# Patient Record
Sex: Female | Born: 1965
Health system: Southern US, Community
[De-identification: ages and names within clinical notes are randomized; demographics above are authoritative.]

## PROBLEM LIST (undated history)

## (undated) DIAGNOSIS — T148XXA Other injury of unspecified body region, initial encounter: Secondary | ICD-10-CM

## (undated) DIAGNOSIS — E119 Type 2 diabetes mellitus without complications: Secondary | ICD-10-CM

## (undated) HISTORY — DX: Type 2 diabetes mellitus without complications: E11.9

## (undated) HISTORY — PX: HERNIA REPAIR: SHX51

## (undated) HISTORY — DX: Other injury of unspecified body region, initial encounter: T14.8XXA

---

## 1998-06-30 ENCOUNTER — Other Ambulatory Visit: Admission: RE | Admit: 1998-06-30 | Discharge: 1998-06-30 | Payer: Self-pay | Admitting: Obstetrics and Gynecology

## 1998-10-08 ENCOUNTER — Encounter: Admission: RE | Admit: 1998-10-08 | Discharge: 1999-01-06 | Payer: Self-pay | Admitting: Obstetrics and Gynecology

## 1998-11-21 ENCOUNTER — Inpatient Hospital Stay (HOSPITAL_COMMUNITY): Admission: AD | Admit: 1998-11-21 | Discharge: 1998-11-25 | Payer: Self-pay | Admitting: Obstetrics and Gynecology

## 1998-12-24 ENCOUNTER — Other Ambulatory Visit: Admission: RE | Admit: 1998-12-24 | Discharge: 1998-12-24 | Payer: Self-pay | Admitting: Obstetrics and Gynecology

## 2001-04-15 ENCOUNTER — Inpatient Hospital Stay (HOSPITAL_COMMUNITY): Admission: EM | Admit: 2001-04-15 | Discharge: 2001-04-17 | Payer: Self-pay | Admitting: Emergency Medicine

## 2001-04-15 ENCOUNTER — Encounter (INDEPENDENT_AMBULATORY_CARE_PROVIDER_SITE_OTHER): Payer: Self-pay

## 2001-09-24 ENCOUNTER — Encounter: Payer: Self-pay | Admitting: *Deleted

## 2001-09-25 ENCOUNTER — Inpatient Hospital Stay (HOSPITAL_COMMUNITY): Admission: AD | Admit: 2001-09-25 | Discharge: 2001-09-26 | Payer: Self-pay | Admitting: *Deleted

## 2001-09-25 ENCOUNTER — Encounter: Payer: Self-pay | Admitting: *Deleted

## 2001-10-05 ENCOUNTER — Ambulatory Visit (HOSPITAL_COMMUNITY): Admission: RE | Admit: 2001-10-05 | Discharge: 2001-10-05 | Payer: Self-pay | Admitting: *Deleted

## 2001-10-05 ENCOUNTER — Encounter: Payer: Self-pay | Admitting: *Deleted

## 2001-12-18 ENCOUNTER — Encounter: Admission: RE | Admit: 2001-12-18 | Discharge: 2001-12-18 | Payer: Self-pay | Admitting: *Deleted

## 2001-12-27 ENCOUNTER — Encounter: Admission: RE | Admit: 2001-12-27 | Discharge: 2001-12-27 | Payer: Self-pay | Admitting: *Deleted

## 2002-01-02 ENCOUNTER — Encounter: Admission: RE | Admit: 2002-01-02 | Discharge: 2002-01-02 | Payer: Self-pay | Admitting: *Deleted

## 2002-01-08 ENCOUNTER — Ambulatory Visit (HOSPITAL_COMMUNITY): Admission: RE | Admit: 2002-01-08 | Discharge: 2002-01-08 | Payer: Self-pay | Admitting: *Deleted

## 2002-01-09 ENCOUNTER — Encounter: Admission: RE | Admit: 2002-01-09 | Discharge: 2002-01-09 | Payer: Self-pay | Admitting: *Deleted

## 2002-01-16 ENCOUNTER — Encounter: Admission: RE | Admit: 2002-01-16 | Discharge: 2002-01-16 | Payer: Self-pay | Admitting: *Deleted

## 2002-01-20 ENCOUNTER — Encounter: Payer: Self-pay | Admitting: Obstetrics and Gynecology

## 2002-01-20 ENCOUNTER — Inpatient Hospital Stay (HOSPITAL_COMMUNITY): Admission: AD | Admit: 2002-01-20 | Discharge: 2002-01-24 | Payer: Self-pay | Admitting: Obstetrics and Gynecology

## 2002-01-20 ENCOUNTER — Encounter (INDEPENDENT_AMBULATORY_CARE_PROVIDER_SITE_OTHER): Payer: Self-pay | Admitting: Specialist

## 2002-01-25 ENCOUNTER — Inpatient Hospital Stay (HOSPITAL_COMMUNITY): Admission: AD | Admit: 2002-01-25 | Discharge: 2002-01-25 | Payer: Self-pay | Admitting: *Deleted

## 2003-04-16 ENCOUNTER — Other Ambulatory Visit: Admission: RE | Admit: 2003-04-16 | Discharge: 2003-04-16 | Payer: Self-pay | Admitting: Gynecology

## 2004-07-13 ENCOUNTER — Other Ambulatory Visit: Admission: RE | Admit: 2004-07-13 | Discharge: 2004-07-13 | Payer: Self-pay | Admitting: Gynecology

## 2004-11-11 ENCOUNTER — Other Ambulatory Visit: Admission: RE | Admit: 2004-11-11 | Discharge: 2004-11-11 | Payer: Self-pay | Admitting: Internal Medicine

## 2005-07-04 ENCOUNTER — Ambulatory Visit: Payer: Self-pay | Admitting: Internal Medicine

## 2005-08-23 ENCOUNTER — Other Ambulatory Visit: Admission: RE | Admit: 2005-08-23 | Discharge: 2005-08-23 | Payer: Self-pay | Admitting: Gynecology

## 2006-05-04 ENCOUNTER — Ambulatory Visit (HOSPITAL_COMMUNITY): Admission: RE | Admit: 2006-05-04 | Discharge: 2006-05-04 | Payer: Self-pay | Admitting: Gynecology

## 2006-08-28 ENCOUNTER — Other Ambulatory Visit: Admission: RE | Admit: 2006-08-28 | Discharge: 2006-08-28 | Payer: Self-pay | Admitting: Gynecology

## 2007-04-16 ENCOUNTER — Ambulatory Visit (HOSPITAL_COMMUNITY): Admission: RE | Admit: 2007-04-16 | Discharge: 2007-04-16 | Payer: Self-pay | Admitting: Internal Medicine

## 2008-08-19 ENCOUNTER — Emergency Department (HOSPITAL_COMMUNITY): Admission: EM | Admit: 2008-08-19 | Discharge: 2008-08-19 | Payer: Self-pay | Admitting: Family Medicine

## 2009-02-17 ENCOUNTER — Emergency Department (HOSPITAL_COMMUNITY): Admission: EM | Admit: 2009-02-17 | Discharge: 2009-02-17 | Payer: Self-pay | Admitting: Emergency Medicine

## 2009-07-08 ENCOUNTER — Ambulatory Visit: Payer: Self-pay | Admitting: Family Medicine

## 2009-08-12 ENCOUNTER — Ambulatory Visit: Payer: Self-pay | Admitting: Internal Medicine

## 2009-08-28 ENCOUNTER — Ambulatory Visit (HOSPITAL_COMMUNITY): Admission: RE | Admit: 2009-08-28 | Discharge: 2009-08-28 | Payer: Self-pay | Admitting: Internal Medicine

## 2009-12-08 ENCOUNTER — Encounter (INDEPENDENT_AMBULATORY_CARE_PROVIDER_SITE_OTHER): Payer: Self-pay | Admitting: Family Medicine

## 2009-12-08 ENCOUNTER — Ambulatory Visit: Payer: Self-pay | Admitting: Internal Medicine

## 2009-12-08 LAB — CONVERTED CEMR LAB
ALT: 15 units/L (ref 0–35)
AST: 15 units/L (ref 0–37)
Albumin: 4.1 g/dL (ref 3.5–5.2)
BUN: 10 mg/dL (ref 6–23)
Basophils Absolute: 0 10*3/uL (ref 0.0–0.1)
Basophils Relative: 1 % (ref 0–1)
Calcium: 9.3 mg/dL (ref 8.4–10.5)
Chloride: 104 meq/L (ref 96–112)
MCHC: 32.3 g/dL (ref 30.0–36.0)
Monocytes Relative: 7 % (ref 3–12)
Neutro Abs: 2 10*3/uL (ref 1.7–7.7)
Neutrophils Relative %: 34 % — ABNORMAL LOW (ref 43–77)
Potassium: 4.3 meq/L (ref 3.5–5.3)
RBC: 4.3 M/uL (ref 3.87–5.11)
WBC: 5.8 10*3/uL (ref 4.0–10.5)

## 2009-12-09 ENCOUNTER — Encounter (INDEPENDENT_AMBULATORY_CARE_PROVIDER_SITE_OTHER): Payer: Self-pay | Admitting: Family Medicine

## 2009-12-20 ENCOUNTER — Emergency Department (HOSPITAL_COMMUNITY): Admission: EM | Admit: 2009-12-20 | Discharge: 2009-12-20 | Payer: Self-pay | Admitting: Family Medicine

## 2010-04-06 ENCOUNTER — Encounter (INDEPENDENT_AMBULATORY_CARE_PROVIDER_SITE_OTHER): Payer: Self-pay | Admitting: *Deleted

## 2010-09-09 LAB — GLUCOSE, CAPILLARY

## 2010-10-13 ENCOUNTER — Other Ambulatory Visit: Payer: Self-pay | Admitting: Family Medicine

## 2010-10-13 ENCOUNTER — Ambulatory Visit (HOSPITAL_COMMUNITY)
Admission: RE | Admit: 2010-10-13 | Discharge: 2010-10-13 | Disposition: A | Discharge status: Home / Self Care | Payer: Self-pay | Source: Ambulatory Visit | Attending: Family Medicine | Admitting: Family Medicine

## 2010-10-13 DIAGNOSIS — M25519 Pain in unspecified shoulder: Secondary | ICD-10-CM | POA: Insufficient documentation

## 2010-10-13 DIAGNOSIS — M25512 Pain in left shoulder: Secondary | ICD-10-CM

## 2010-10-15 NOTE — Op Note (Signed)
NAME:  Kristen West, Kristen West                      ACCOUNT NO.:  [id number]   MEDICAL RECORD NO.:  [id number]                   PATIENT TYPE:  INP   LOCATION:  9104                                 FACILITY:  [facility]   PHYSICIAN:  Phil D. Okey Dupre, M.D.                  DATE OF BIRTH:  May 15, 1966   DATE OF PROCEDURE:  01/20/2002  DATE OF DISCHARGE:                                 OPERATIVE REPORT   PROCEDURE:  Low flap cesarean section with low vertical extension.   PREOPERATIVE DIAGNOSIS:  1. 35-4/[redacted] week gestation with premature ruptured membranes.  2. Previous cesarean section.  3. Oblique lie.   POSTOPERATIVE DIAGNOSIS:  1. 35-4/[redacted] week gestation with premature rupture membranes.  2. Previous cesarean section.  3. Transverse lie with back down.   PROCEDURE:  Under satisfactory spinal anesthesia, the patient in the dorsal  spine position the abdomen was prepped and West Foley catheter placed in the  urinary bladder, the abdomen was then draped in the usual sterile manner.  The abdomen was entered through West previous transverse Pfannenstiel incision  and the abdomen was opened by layers. This was situated 3 cm above the  symphysis pubis and extended for West total up to 16 cm.  On entering the  peritoneal cavity the visceral peritoneum and the anterior surface of the  uterus was opened transversely by sharp dissection and the bladder pushed  away from the lower uterine segment. It was entered by sharp and blunt  dissection.  Immediately I went for the baby's head and found that I had an  arm and West hand. I tried to manipulate to grab either the vertex or West leg but  the back was done and after some manipulation I realized this was not  feasible and extended the incision vertically for approximately 5 cm with West  bandage scissors.  This allowed me to grab the left leg which I brought down  and delivered as West breech extraction.  The cord was doubled clamped,  divided. The baby was handed to the  pediatrician and the placenta was  manually removed.  The uterus explored and closed by their incision being in  the midpoint with West continuous running, lock 0 Vicryl and atraumatic needle.  The vertical portion of the incision was run from the top down with  continuous running lock 0 Vicryl and an atraumatic needle and at the bottom  it was merged with the previous incision.  Two figure-of-eights were used  where there was some oozing along the suture line using the same suture. The  area was observed for bleeding and was noted and all sutures were cut short.  The fascia was then closed with the incision meeting in the midpoint with West  continuous running alternating lock 0 Vicryl and an atraumatic needle.  Subcutaneous bleeders were controlled with hot cautery.  Skin staples were  used in the skin as  approximation. West dry, sterile dressing was applied. The  patient was transferred to the recovery room in satisfactory condition  having tolerated the procedure well. Tape, instrument, sponge and needle  count reported correct at the end of the procedure. The baby was West female  infant. No weight available at this time.  The cord pH was 7.37 and Apgars  of 6 and 9.  The baby had somewhat underdeveloped genitalia with West  significant hypospadius according to the pediatrician.                                               Phil D. Okey Dupre, M.D.    PDR/MEDQ  D:  01/20/2002  T:  01/21/2002  Job:  [id number]

## 2010-10-15 NOTE — H&P (Signed)
[facility]  Patient:    Kristen West, OLDS Visit Number: [id number] MRN: [id number]          Service Type: SUR Location: 4W 0448 01 Attending Physician:  Bonnetta Barry Dictated by:   Velora Heckler, M.D. Admit Date:  04/15/2001                           History and Physical  REFERRING PHYSICIAN:  Dr. Smitty Cords. Cheek from the emergency department.  REASON FOR ADMISSION:  Incarcerated umbilical hernia with probable strangulated omentum.  BRIEF HISTORY:  Patient is a 45 year old [place] female who apparently had seen her primary care physician earlier this week about abdominal pain and incarcerated umbilical hernia.  Patient had had an umbilical hernia present for approximately two years.  This was originally noted during her recent pregnancy.  However, the patient did not proceed with elective repair.  Over the past three days, the patient has had notable increase in the size of the hernia with pain, tenderness and an area of ecchymosis over the umbilicus. Patient presents today to the emergency department for evaluation and management.  Patient denies nausea and vomiting.  She has had normal bowel movements.  She denies fever or chills.  The patient was seen and evaluated in the emergency department by Dr. Beverely Pace.  He feels this is an incarcerated umbilical hernia with possible strangulation.  Laboratory studies are within normal limits.  EKG shows normal sinus rhythm.  General surgery is now called for admission and management.  PAST MEDICAL HISTORY:  History of childbirth, June 2000, by cesarean section at [facility] in [place].  History of positive PPD, on unknown medication for treatment.  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  ALLERGIES:  None known.  SOCIAL HISTORY:  Patient is accompanied by her husband.  They live in [place].  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  She  does not smoke. She does not drink alcohol.  FAMILY HISTORY:  Noncontributory.  REVIEW OF SYSTEMS:  Fifteen-system review unremarkable except as noted above.  PHYSICAL EXAMINATION:  GENERAL:  [age]-year-old well-developed, well-nourished female.  VITAL SIGNS:  Temperature 98.0, pulse 108, respirations 20, blood pressure 135/78.  HEENT:  Normocephalic, atraumatic.  Sclerae are clear.  Dentition is good.  NECK:  Supple without masses.  Thyroid is normal without nodularity.  LUNGS:  Clear to auscultation.  There is no costovertebral angle tenderness.  CARDIAC:  Exam shows a mild sinus tachycardia with regular rhythm.  There are no murmurs.  ABDOMEN:  Soft, protuberant, with an obvious incarcerated umbilical hernia. There is ecchymosis overlying the hernia.  It is exquisitely tender to palpation.  It measures approximately 6 to 7 cm at greatest dimension.  It is not reducible.  There is a well-healed Pfannenstiel incision without sign of hernia.  Remainder of the abdomen is soft and nontender.  EXTREMITIES:  Nontender without edema.  NEUROLOGIC:  The patient is alert and oriented to person, place and time without focal neurologic deficits.  LABORATORY STUDIES:  Complete blood count shows a white count of 5.5, a hemoglobin of 12.9, platelet count 326,000.  Differential is normal. Chemistry profile is within normal limits.  Liver function tests are normal. Urinalysis is benign.  EKG:  Twelve-lead EKG shows normal sinus rhythm without acute change.  IMPRESSION:  Incarcerated umbilical hernia, likely strangulated omentum.  PLAN:  I discussed at length with the patient and  her husband the indications for urgent surgery, both for symptomatic relief and to rule out endangerment of the intestine.  Patient will be taken directly to the operating room here at [facility] for exploration and repair of incarcerated umbilical hernia.  Patient may or may not  require use of prosthetic mesh. Patient may or may not require a subcutaneous drainage.  Patient and her husband are in agreement.  They wish to proceed. Dictated by:   Velora Heckler, M.D. Attending Physician:  Bonnetta Barry DD:  04/15/01 TD:  04/16/01 Job: [id number]

## 2010-10-15 NOTE — Discharge Summary (Signed)
NAME:  Kristen West, Kristen West                      ACCOUNT NO.:  [id number]   MEDICAL RECORD NO.:  [id number]                   PATIENT TYPE:  INP   LOCATION:  9104                                 FACILITY:  [facility]   PHYSICIAN:  Phil D. Okey Dupre, M.D.                  DATE OF BIRTH:  [birth year]   DATE OF ADMISSION:  01/20/2002  DATE OF DISCHARGE:  01/24/2002                                 DISCHARGE SUMMARY   PROCEDURE:  Low flap cesarean section with low vertical extension.   HISTORY OF PRESENT ILLNESS:  This 45 year old G2, P1-0-0-1 presented to the  MAU at [facility] at 35 weeks and 4/7 days gestational age complaining  of leaking of fluid at 8:30 that morning.  The patient was receiving her  prenatal care at the high risk clinic secondary to gestational diabetes  which she is controlling with diet.   OB HISTORY:  OB history includes West prior cesarean section secondary to fetal  bradycardia in 2000 and the patient is also group B streptococcus positive.  She was treated with penicillin in July 2003.   LABORATORY DATA:  The patient's prenatal labs included blood type West  positive.  Antibody screen negative.  Hemoglobin 13.4.  Platelets 237.  Rubella immune.  Hepatitis negative.  Syphilis negative. HIV negative.  Gonorrhea and Chlamydia negative.   PHYSICAL EXAMINATION:  Vital signs were stable on admission.  Temperature  98.  Pulse 83.  Blood pressure 137/85.  Exam showed positive pulling,  positive Nitrazine and positive ferning.  The patient was admitted after  ultrasound revealed West fetus in transverse/oblique lie and was prepped for  cesarean section by Dr. Okey Dupre.   PROCEDURE:  Dr. Okey Dupre performed West low flap cesarean section with low vertical  extension as fetal lie was transverse with back down.  West viable female infant  was born with Apgars of 6 at one minute and 9 at five minutes.  The  procedure was uncomplicated with an estimated blood loss of 700 cc.   HOSPITAL COURSE:  The  patient tolerated the cesarean section well.  Her  postoperative hemoglobin was 10.1 on January 22, 2002.  On postop day #3 the  patient was feeling well.  Her infant is in the NICU here in [facility] and she is pumping her breast milk.  Vital signs are stable.  She  is afebrile. Blood pressure 128/70 on the day of discharge.  She does not  desire any contraception when she goes home.   DISCHARGE DISPOSITION:  The patient is stable.   DISCHARGE MEDICATIONS:  1. Percocet 5/325 mg one p.o. q.4h. p.r.n. pain.  2. Prenatal vitamins one p.o. q.d. #30 given.  3. Colace 100 mg one p.o. q.h.s. p.r.n. constipation #30.   DIET:  Regular.   ACTIVITY:  Pelvic rest times 6 weeks with nothing in the vagina.    FOLLOW UP:  The patient is  to return to [facility] in six weeks for her  postpartum check, and the patient will return to MAU in two days for staple  removal.     Billey Gosling, M.D.                       Phil D. Okey Dupre, M.D.    AS/MEDQ  D:  01/23/2002  T:  01/24/2002  Job:  [id number]

## 2010-10-15 NOTE — Op Note (Signed)
[facility]  Patient:    Kristen West, Kristen West Visit Number: [id number] MRN: [id number]          Service Type: SUR Location: 4W 0448 01 Attending Physician:  Bonnetta Barry Dictated by:   Velora Heckler, M.D. Proc. Date: 04/15/01 Admit Date:  04/15/2001                             Operative Report  PREOPERATIVE DIAGNOSIS:  Incarcerated umbilical hernia, strangulated omentum.  POSTOPERATIVE DIAGNOSIS:  Incarcerated umbilical hernia, strangulated omentum.  PROCEDURE:  Repair of incarcerated umbilical hernia, partial omentectomy.  SURGEON:  Velora Heckler, M.D.  ANESTHESIA:  General per Lucille Passy, M.D.  ESTIMATED BLOOD LOSS:  Minimal.  PREPARATION:  Betadine.  COMPLICATIONS:  None.  INDICATIONS:  The patient is a 45 year old [place] female who presents today to the emergency department with a three day history of severe abdominal pain and incarcerated umbilical hernia.  She had had an umbilical hernia present for approximately two years.  The patient now comes to surgery urgently for repair.  DESCRIPTION OF PROCEDURE:  The procedure was done in OR #1 at the [facility].  The patient was brought to the operating room and placed in the supine position on the operating room table.  Following the administration of general anesthesia, the patient was prepped and draped in the usual strict aseptic fashion.  After ascertaining that an adequate level of anesthesia had been obtained, an infraumbilical incision was made with a #10 blade.  Dissection was carried down through the subcutaneous tissues and hemostasis obtained with the electrocautery.  The fascial plane is developed. The neck of the hernia is identified.  Hernia sac is incised.  It contains approximately 30 cc of serous fluid which was evacuated.  There was apparent incarcerated strangulated omentum.  The hernia sac was dissected away from the under surface of  the umbilicus.  The fascia is defined circumferentially.  The hernia ring was opened.  Omentum is pulled up through the hernia defect until viable omentum was noted.  Omentum was then divided between hemostats and ligated with 2-0 silk suture ligatures.  The hernia sac and incarcerated omentum are passed off the field as a specimen.  The hernia defect measures approximately 3 cm in greatest dimension.  The edges are debrided to healthy fascia.  The wound was then closed transversely with interrupted 0 Ethibond sutures.  Good hemostasis was noted.  The umbilicus was then reaffixed to the abdominal wall with interrupted 0 Vicryl sutures.  The subcutaneous tissues are closed with interrupted 3-0 Vicryl sutures.  The skin edges are closed with stainless steel staples.  Sterile dressings are applied.  The patient was awakened from anesthesia and brought to the recovery room in stable condition.  The patient tolerated the procedure well. Dictated by:   Velora Heckler, M.D. Attending Physician:  Bonnetta Barry DD:  04/15/01 TD:  04/16/01 Job: [id number]

## 2010-10-15 NOTE — Discharge Summary (Signed)
[facility]  Patient:    Kristen West, Kristen West Visit Number: [id number] MRN: [id number]          Service Type: OBS Location: 9300 [id number] 01 Attending Physician:  Michaelle Copas Dictated by:   Velora Heckler, M.D. Admit Date:  09/24/2001 Discharge Date: 09/26/2001   CC:         Conni Elliot, M.D.   Discharge Summary  REASON FOR ADMISSION:         Rule out colonic obstruction.  BRIEF HISTORY:                The patient is a 45 year old female presents to the maternity admissions at [redacted] weeks gestation of her second pregnancy for evaluation for abdominal pain and diarrhea.  The patient was seen and evaluated by Dr. Corky Sox.  General surgery was consulted for possible bowel obstruction.  HOSPITAL COURSE:              The patient was seen in maternity admissions. Abdominal x-rays were obtained, which demonstrated a diffuse dilated colon and small bowel with air fluid levels consistent with either ileus or distal colonic obstruction.  The patient was admitted, placed on intravenous hydration, maintained n.p.o.  The following morning, September 25, 2001, she underwent repeat abdominal x-ray showing a similar-type gas pattern.  After extensive discussion between obstetrics, radiology, and general surgery, we proceeded with Gastrografin enema.  This demonstrated no evidence of distal colonic obstruction.  Postprocedure, the patient had onset of flatus and bowel movements.  She felt greatly relieved.  She was started on a clear-liquid diet.  Over the ensuing 24 hours, she was advanced to a regular diet, which she tolerated well.  On the morning of discharge, the patient was without pain and tolerating a regular diet.  DISCHARGE PLANNING:           The patient is discharged home on September 26, 2001 in good condition, tolerating a regular diet, without abdominal discomfort. Normal gastrointestinal function had returned.  The patient had  remained stable throughout her hospital course.  DISCHARGE MEDICATIONS:        1. Prenatal vitamins.                               2. Tylenol as needed.  FOLLOWUP:                     The patient will follow up with her obstetrician at her regularly scheduled appointment.  FINAL DIAGNOSIS:              Colonic ileus, probable gastroenteritis.  CONDITION AT DISCHARGE:       Improved. Dictated by:   Velora Heckler, M.D. Attending Physician:  Michaelle Copas DD:  09/26/01 TD:  09/27/01 Job: [id number]

## 2011-12-28 ENCOUNTER — Encounter (HOSPITAL_COMMUNITY): Payer: Self-pay | Admitting: Emergency Medicine

## 2011-12-28 ENCOUNTER — Emergency Department (INDEPENDENT_AMBULATORY_CARE_PROVIDER_SITE_OTHER)
Admission: EM | Admit: 2011-12-28 | Discharge: 2011-12-28 | Disposition: A | Discharge status: Home / Self Care | Payer: Self-pay | Source: Home / Self Care

## 2011-12-28 DIAGNOSIS — N61 Mastitis without abscess: Secondary | ICD-10-CM

## 2011-12-28 MED ORDER — DOXYCYCLINE HYCLATE 100 MG PO TABS: 100.0000 mg | ORAL_TABLET | Freq: Two times a day (BID) | ORAL | Status: AC

## 2011-12-28 MED ORDER — CEFTRIAXONE SODIUM 1 G IJ SOLR
1.0000 g | Freq: Once | INTRAMUSCULAR | Status: AC
  Administered 2011-12-28: 1 g via INTRAMUSCULAR

## 2011-12-28 MED ORDER — LIDOCAINE HCL (PF) 1 % IJ SOLN
INTRAMUSCULAR | Status: AC
  Filled 2011-12-28: qty 5

## 2011-12-28 MED ORDER — CEFTRIAXONE SODIUM 1 G IJ SOLR
INTRAMUSCULAR | Status: AC
  Filled 2011-12-28: qty 10

## 2011-12-28 NOTE — ED Notes (Signed)
Pain in left breast for 3-4 days.  Reports a hard knot, redness, pain.  No history of this, no injury.  Reports regular mammograms and nothing has been wrong. It has been at least a year since last mammogram.  Reports fever yesterday

## 2011-12-28 NOTE — ED Notes (Signed)
Patient and family member asking questions about how to get medicine filled, dicussed options -area pharmacy promotional  programs

## 2011-12-28 NOTE — ED Notes (Signed)
Extensive search for cheapest medications.  Given pharmacy cards displayed at urgent care

## 2011-12-28 NOTE — ED Provider Notes (Signed)
History     CSN: [id number]  Arrival date & time 12/28/11  1225   First MD Initiated Contact with Patient 12/28/11 1252      Chief Complaint  Patient presents with  . Breast Pain    (Consider location/radiation/quality/duration/timing/severity/associated sxs/prior treatment) HPI Comments: 46 year old female with a schedule mammograms comes in for left breast pain, redness for the last 3 days prior to her visit. Progressively getting worse. She relates no cuts or sores no trauma.  The history is provided by the patient.    History reviewed. No pertinent past medical history.  Past Surgical History  Procedure Date  . Hernia repair   . Cesarean section     No family history on file.  History  Substance Use Topics  . Smoking status: Never Smoker   . Smokeless tobacco: Not on file  . Alcohol Use: No    OB History    Grav Para Term Preterm Abortions TAB SAB Ect Mult Living                  Review of Systems  Constitutional: Negative for fever, chills, activity change and appetite change.    Allergies  Review of patient's allergies indicates not on file.  Home Medications   Current Outpatient Rx  Name Route Sig Dispense Refill  . IBUPROFEN 200 MG PO TABS Oral Take 200 mg by mouth every 6 (six) hours as needed.      BP 112/77  Pulse 82  Temp 98.1 F (36.7 C) (Oral)  Resp 16  SpO2 99%  Physical Exam  Nursing note and vitals reviewed. Constitutional: She is oriented to person, place, and time. Vital signs are normal. She appears well-developed and well-nourished.  Non-toxic appearance. She does not have a sickly appearance. She does not appear ill. No distress.  Pulmonary/Chest: Effort normal. Left breast exhibits no inverted nipple, no mass, no nipple discharge, no skin change and no tenderness. Breasts are symmetrical.  Neurological: She is alert and oriented to person, place, and time.  Skin: Skin is warm. No rash noted. There is erythema. No pallor.       ED Course  Procedures (including critical care time)  Labs Reviewed - No data to display No results found.   No diagnosis found.    MDM  46 year old comes in for breast pain, with redness erythema and warm to touch likely a cellulitis. No fluctuating masses or abscess. We'll give her Rocephin one dose of intramuscular now. Doxy the next 7 days. Patient is fasting during daylight for the next one week we'll start her on intramuscular Rocephin and continue Doxy.        Marinda Elk, MD 12/28/11 Zollie Pee

## 2011-12-28 NOTE — ED Notes (Signed)
Instructed to change into gown

## 2011-12-28 NOTE — ED Notes (Signed)
Discharge delay secondary to post antibiotic injection delay

## 2011-12-28 NOTE — ED Notes (Signed)
Assisted with physician exam/evaluation of breast.

## 2012-07-27 ENCOUNTER — Encounter (HOSPITAL_COMMUNITY): Payer: Self-pay | Admitting: Emergency Medicine

## 2012-07-27 ENCOUNTER — Emergency Department (INDEPENDENT_AMBULATORY_CARE_PROVIDER_SITE_OTHER)
Admission: EM | Admit: 2012-07-27 | Discharge: 2012-07-27 | Disposition: A | Discharge status: Home / Self Care | Payer: No Typology Code available for payment source | Source: Home / Self Care

## 2012-07-27 DIAGNOSIS — L309 Dermatitis, unspecified: Secondary | ICD-10-CM

## 2012-07-27 DIAGNOSIS — IMO0002 Reserved for concepts with insufficient information to code with codable children: Secondary | ICD-10-CM

## 2012-07-27 DIAGNOSIS — S46911A Strain of unspecified muscle, fascia and tendon at shoulder and upper arm level, right arm, initial encounter: Secondary | ICD-10-CM

## 2012-07-27 DIAGNOSIS — L259 Unspecified contact dermatitis, unspecified cause: Secondary | ICD-10-CM

## 2012-07-27 DIAGNOSIS — M65839 Other synovitis and tenosynovitis, unspecified forearm: Secondary | ICD-10-CM

## 2012-07-27 MED ORDER — NAPROXEN 375 MG PO TABS: 375.0000 mg | ORAL_TABLET | Freq: Two times a day (BID) | ORAL | Status: DC

## 2012-07-27 MED ORDER — TRIAMCINOLONE ACETONIDE 0.1 % EX CREA: TOPICAL_CREAM | Freq: Two times a day (BID) | CUTANEOUS | Status: DC

## 2012-07-27 NOTE — ED Notes (Signed)
Pt is here for pain on right arm x1 month States the pain starts out on the shoulder and radiates downward; numbness of bilateral middle finger c/o left hand pain; sx include: bruising Denies inj/trauma to right arm and left hand Also c/o a rash/eczema on right foot; sx include: itching Used to go to [facility] but due to their closure has not been able to find new PCP  She is alert w/no signs of acute distress.

## 2012-07-27 NOTE — ED Provider Notes (Signed)
Medical screening examination/treatment/procedure(s) were performed by resident physician or non-physician practitioner and as supervising physician I was immediately available for consultation/collaboration.   Barkley Bruns MD.   Linna Hoff, MD 07/27/12 [phone]

## 2012-07-27 NOTE — ED Provider Notes (Signed)
History     CSN: [id number]  Arrival date & time 07/27/12  1037   First MD Initiated Contact with Patient 07/27/12 1044      Chief Complaint  Patient presents with  . Arm Pain    (Consider location/radiation/quality/duration/timing/severity/associated sxs/prior treatment) HPI Comments: 47-year-old [place] female presents with pain in her left wrist and thumb, pain in her right posterior shoulder and deltoid and a rash on her right foot. It is unclear how long she has had some new symptoms but the right arm discomfort for approximately one month. The rash  For several months. Denies any known injury or trauma.  The pain on her right shoulder is worse with certain movements such as abduction beyond the 100 and placing on behind the back. Pain in the thumb is worse with grasping and other movements involving the thumb.   History reviewed. No pertinent past medical history.  Past Surgical History  Procedure Laterality Date  . Hernia repair    . Cesarean section      No family history on file.  History  Substance Use Topics  . Smoking status: Never Smoker   . Smokeless tobacco: Not on file  . Alcohol Use: No    OB History   Grav Para Term Preterm Abortions TAB SAB Ect Mult Living                  Review of Systems  Constitutional: Negative.   HENT: Negative.   Respiratory: Negative.   Gastrointestinal: Negative.   Musculoskeletal: Positive for myalgias and arthralgias. Negative for back pain, joint swelling and gait problem.  Skin: Positive for rash.  Neurological: Negative.     Allergies  Review of patient's allergies indicates no known allergies.  Home Medications   Current Outpatient Rx  Name  Route  Sig  Dispense  Refill  . ibuprofen (ADVIL,MOTRIN) 200 MG tablet   Oral   Take 200 mg by mouth every 6 (six) hours as needed.         . naproxen (NAPROSYN) 375 MG tablet   Oral   Take 1 tablet (375 mg total) by mouth 2 (two) times daily. Prn shoulder  pain   20 tablet   0   . triamcinolone cream (KENALOG) 0.1 %   Topical   Apply topically 2 (two) times daily.   30 g   0     BP 144/83  Pulse 90  Temp(Src) 99.9 F (37.7 C) (Oral)  Resp 17  SpO2 99%  Physical Exam  Nursing note and vitals reviewed. Constitutional: She appears well-developed and well-nourished. No distress.  HENT:  Head: Normocephalic and atraumatic.  Neck: Normal range of motion. Neck supple.  Cardiovascular: Normal rate and regular rhythm.   Pulmonary/Chest: Effort normal. No respiratory distress.  Musculoskeletal: She exhibits no edema.  Right shoulder evaluation reveals pain in the posterior and lateral aspect of the shoulder in the latissimus dorsi muscle. There is also mild tenderness in the upper right deltoid. It is worse with moving her arm backwards. There is no tenderness along the joint line. No swelling or asymmetry. Left thumb reveals a small area of tenderness just proximal to the MCP. Lourena Simmonds is equivocally positive and there is tenderness over the associated tendon. There is mild tenderness along the border of the thenar eminence. Right foot has a well marginated overweight scaly itchy rash to the medial aspect.  Neurological: She is alert. She exhibits normal muscle tone. Coordination normal.  Skin: Skin is  warm and dry. Rash noted.  Psychiatric: She has a normal mood and affect.       Procedures (including critical care time)  Labs Reviewed - No data to display No results found.   1. Shoulder strain, right, initial encounter   2. Tendinitis of thumb   3. Eczema       MDM  Naprosyn 375 mg twice a day with food when necessary pain in the thumb and right shoulder. Do not take ibuprofen with it. Triamcinolone cream applied to the eczematoid rash on the right foot. Apply a splint to the left thumb to wear for at least one week. Followup with primary care doctor which will be the [facility], get an appointment  today.        Hayden Rasmussen, NP 07/27/12 1344

## 2012-09-14 ENCOUNTER — Encounter (HOSPITAL_COMMUNITY): Payer: Self-pay

## 2012-09-14 ENCOUNTER — Emergency Department (HOSPITAL_COMMUNITY)
Admission: EM | Admit: 2012-09-14 | Discharge: 2012-09-14 | Disposition: A | Discharge status: Home Health | Payer: No Typology Code available for payment source | Source: Home / Self Care

## 2012-09-14 DIAGNOSIS — M129 Arthropathy, unspecified: Secondary | ICD-10-CM

## 2012-09-14 DIAGNOSIS — M654 Radial styloid tenosynovitis [de Quervain]: Secondary | ICD-10-CM

## 2012-09-14 DIAGNOSIS — M1990 Unspecified osteoarthritis, unspecified site: Secondary | ICD-10-CM

## 2012-09-14 DIAGNOSIS — G5600 Carpal tunnel syndrome, unspecified upper limb: Secondary | ICD-10-CM

## 2012-09-14 DIAGNOSIS — G5601 Carpal tunnel syndrome, right upper limb: Secondary | ICD-10-CM

## 2012-09-14 MED ORDER — OMEPRAZOLE 20 MG PO CPDR: 20.0000 mg | DELAYED_RELEASE_CAPSULE | Freq: Every day | ORAL | Status: DC

## 2012-09-14 MED ORDER — NAPROXEN 500 MG PO TABS: 500.0000 mg | ORAL_TABLET | Freq: Two times a day (BID) | ORAL | Status: DC

## 2012-09-14 MED ORDER — TRIAMCINOLONE ACETONIDE 0.1 % EX CREA: TOPICAL_CREAM | Freq: Two times a day (BID) | CUTANEOUS | Status: DC

## 2012-09-14 NOTE — Progress Notes (Signed)
Patient Demographics  Kristen West, is a 47 y.o. female  HYQ:[id number]  [id number]:[id number]  DOB - 06/23/1965  Chief Complaint  Patient presents with  . Foot Pain        Subjective:   Kristen West today is here to establish primary care. Patient has No headache, No chest pain, No abdominal pain - No Nausea, No new weakness tingling or numbness, No Cough - SOB. She claims that she has tingling and numbness in her right hand particularly at night, claims that her right thenar muscles have atrophy. Claims to have left thumb area pain as well for the past 3 months.  Objective:    Filed Vitals:   09/14/12 1107  BP: 109/69  Pulse: 93  Temp: 97.4 F (36.3 C)  TempSrc: Oral  Resp: 17     ALLERGIES:  No Known Allergies  PAST MEDICAL HISTORY: Eczema  PAST SURGICAL HISTORY: Past Surgical History  Procedure Laterality Date  . Hernia repair    . Cesarean section      FAMILY HISTORY: No family history of CAD  MEDICATIONS AT HOME: Prior to Admission medications   Medication Sig Start Date End Date Taking? Authorizing Provider  naproxen (NAPROSYN) 500 MG tablet Take 1 tablet (500 mg total) by mouth 2 (two) times daily with a meal. Prn shoulder pain 09/14/12   Maretta Bees, MD  omeprazole (PRILOSEC) 20 MG capsule Take 1 capsule (20 mg total) by mouth daily. 09/14/12   Kristen Kohlman Levora Dredge, MD  triamcinolone cream (KENALOG) 0.1 % Apply topically 2 (two) times daily. 09/14/12   Kristen Lamay Levora Dredge, MD    REVIEW OF SYSTEMS:  Constitutional:   No   Fevers, chills, fatigue.  HEENT:    No headaches, Sore throat,   Cardio-vascular: No chest pain,  Orthopnea, swelling in lower extremities, anasarca, palpitations  GI:  No abdominal pain, nausea, vomiting, diarrhea  Resp: No shortness of breath,  No coughing up of blood.No cough.No wheezing.  Skin:  no rash or lesions.  GU:  no dysuria, change in color of urine, no urgency or frequency.  No flank  pain.  Musculoskeletal: No joint pain or swelling.  No decreased range of motion.  No back pain.  Psych: No change in mood or affect. No depression or anxiety.  No memory loss.   Exam  General appearance :Awake, alert, not in any distress. Speech Clear. Not toxic Looking HEENT: Atraumatic and Normocephalic, pupils equally reactive to light and accomodation Neck: supple, no JVD. No cervical lymphadenopathy.  Chest:Good air entry bilaterally, no added sounds  CVS: S1 S2 regular, no murmurs.  Abdomen: Bowel sounds present, Non tender and not distended with no gaurding, rigidity or rebound. Extremities: B/L Lower Ext shows no edema, both legs are warm to touch. Positive for right thenar muscle atrophy Neurology: Awake alert, and oriented X 3, CN II-XII intact, Non focal Skin:No Rash Wounds:N/A    Data Review   CBC No results found for this basename: WBC, HGB, HCT, PLT, MCV, MCH, MCHC, RDW, NEUTRABS, LYMPHSABS, MONOABS, EOSABS, BASOSABS, BANDABS, BANDSABD,  in the last 168 hours  Chemistries   No results found for this basename: NA, K, CL, CO2, GLUCOSE, BUN, CREATININE, GFRCGP, CALCIUM, MG, AST, ALT, ALKPHOS, BILITOT,  in the last 168 hours ------------------------------------------------------------------------------------------------------------------ No results found for this basename: HGBA1C,  in the last 72 hours ------------------------------------------------------------------------------------------------------------------ No results found for this basename: CHOL, HDL, LDLCALC, TRIG, CHOLHDL, LDLDIRECT,  in the last 72 hours ------------------------------------------------------------------------------------------------------------------ No results found for this  basename: TSH, T4TOTAL, FREET3, T3FREE, THYROIDAB,  in the last 72 hours ------------------------------------------------------------------------------------------------------------------ No results found for this  basename: VITAMINB12, FOLATE, FERRITIN, TIBC, IRON, RETICCTPCT,  in the last 72 hours  Coagulation profile  No results found for this basename: INR, PROTIME,  in the last 168 hours    Assessment & Plan   Right thumb pain - Suspect de Quervain's tenosynovitis  - Repeat a naproxen 500 mg twice daily with meals - Apply cold compresses - Reassess next visit - Provided literature for patient - If persists-may need orthopedic/sports medicine evaluation  Arthritis - Claims to have been given mostly MCP areas of bilateral hand - Checking RA, anti-CCP, ESR  Suspected carpal tunnel syndrome - has right thenar muscle atrophy and numbness especially at night - Referred to neurology-have askedRN to make appropriate referral  Eczema - RIGHT foot - Continue with triamcinolone cream  Please check routine labs at next visit-have ordered- CBC, chemistries, TSH, lipids, A1c, RA factor, anti-CCP antibodies  Follow-up Information   Follow up with [facility]. Schedule an appointment as soon as possible for a visit in 1 month.

## 2012-09-14 NOTE — ED Notes (Signed)
Referral faxed to [facility] for neurology appt

## 2012-09-14 NOTE — ED Notes (Signed)
Patient is here to be seen for foot and shoulder pain Husband states she has had pain for 10 years

## 2012-09-14 NOTE — ED Notes (Signed)
Referral faxed to [facility]

## 2012-09-25 NOTE — ED Notes (Signed)
Referral faxed to neurology appt 12/03/12 Dr Leanna Battles

## 2013-05-15 ENCOUNTER — Emergency Department (INDEPENDENT_AMBULATORY_CARE_PROVIDER_SITE_OTHER): Payer: No Typology Code available for payment source

## 2013-05-15 ENCOUNTER — Emergency Department (HOSPITAL_COMMUNITY)
Admission: EM | Admit: 2013-05-15 | Discharge: 2013-05-15 | Disposition: A | Discharge status: Home / Self Care | Payer: No Typology Code available for payment source | Source: Home / Self Care | Attending: Family Medicine | Admitting: Family Medicine

## 2013-05-15 ENCOUNTER — Encounter (HOSPITAL_COMMUNITY): Payer: Self-pay | Admitting: Emergency Medicine

## 2013-05-15 DIAGNOSIS — M79641 Pain in right hand: Secondary | ICD-10-CM

## 2013-05-15 DIAGNOSIS — M79609 Pain in unspecified limb: Secondary | ICD-10-CM

## 2013-05-15 MED ORDER — DICLOFENAC SODIUM 50 MG PO TBEC: 50.0000 mg | DELAYED_RELEASE_TABLET | Freq: Two times a day (BID) | ORAL | Status: DC

## 2013-05-15 NOTE — ED Notes (Signed)
Patient tripped and fell while walking on concrete on Saturday.  Reports the complaint today is right hand pain, no wrist pain.  Reports she tried to stop fall, but landed on right hand, opened.  Now has pain in hand behind fingers, in palm and back of hand

## 2013-05-15 NOTE — ED Provider Notes (Signed)
Kristen West is a 47 y.o. female who presents to Urgent Care today for right hand pain. Patient tripped and fell 3 days ago injuring her right hand. She has pain at the distal fourth and fifth metacarpals. She denies any wrist or elbow pain. She denies any difficulties motion radiating pain weakness or numbness. She has not tried any medications yet. She feels well otherwise. No nausea vomiting or diarrhea.   History reviewed. No pertinent past medical history. History  Substance Use Topics  . Smoking status: Never Smoker   . Smokeless tobacco: Not on file  . Alcohol Use: No   ROS as above Medications reviewed. No current facility-administered medications for this encounter.   Current Outpatient Prescriptions  Medication Sig Dispense Refill  . diclofenac (VOLTAREN) 50 MG EC tablet Take 1 tablet (50 mg total) by mouth 2 (two) times daily.  30 tablet  0  . [DISCONTINUED] omeprazole (PRILOSEC) 20 MG capsule Take 1 capsule (20 mg total) by mouth daily.  30 capsule  0    Exam:  BP 113/66  Pulse 82  Temp(Src) 98.1 F (36.7 C) (Oral)  Resp 18  SpO2 100% Gen: Well NAD Right hand: Swelling and ecchymosis present distal fourth and fifth metacarpals. Tender palpation fourth and fifth metacarpals. Nontender otherwise and hand or wrist. Motion is intact. Capillary refill and sensation are intact distally. Elbow and shoulder on the right side are nontender with normal motion.   No results found for this or any previous visit (from the past 24 hour(s)). Dg Hand Complete Right  05/15/2013   CLINICAL DATA:  Traumatic injury and pain  EXAM: RIGHT HAND - COMPLETE 3+ VIEW  COMPARISON:  None.  FINDINGS: There is no evidence of fracture or dislocation. There is no evidence of arthropathy or other focal bone abnormality. Soft tissues are unremarkable.  IMPRESSION: No acute abnormality noted.   Electronically Signed   By: Alcide Clever M.D.   On: 05/15/2013 12:50   The fourth and fifth digits were  buddy taped together in a dorsal splint was applied over both digits crossing the DIP and MCP  Assessment and Plan: 47 y.o. female with hand pain following fall. No obvious ligament or bone injury. Patient requests a splint.  I placed a dorsal splint to use for about one week. Additionally use diclofenac. Followup if not improved. Discussed warning signs or symptoms. Please see discharge instructions. Patient expresses understanding.      Rodolph Bong, MD 05/15/13 1318

## 2013-12-28 DIAGNOSIS — Q159 Congenital malformation of eye, unspecified: Secondary | ICD-10-CM | POA: Insufficient documentation

## 2014-01-08 LAB — HEMOGLOBIN A1C: Hemoglobin A1C: 7.5

## 2014-01-08 LAB — BASIC METABOLIC PANEL: Glucose: 197

## 2014-02-10 ENCOUNTER — Ambulatory Visit: Payer: Self-pay

## 2014-08-28 ENCOUNTER — Ambulatory Visit: Payer: Self-pay | Attending: Internal Medicine | Admitting: Internal Medicine

## 2014-08-28 ENCOUNTER — Encounter: Payer: Self-pay | Admitting: Internal Medicine

## 2014-08-28 VITALS — BP 117/77 | HR 74 | Temp 98.5°F | Resp 16 | Ht <= 58 in | Wt 119.6 lb

## 2014-08-28 DIAGNOSIS — J302 Other seasonal allergic rhinitis: Secondary | ICD-10-CM

## 2014-08-28 DIAGNOSIS — Z8739 Personal history of other diseases of the musculoskeletal system and connective tissue: Secondary | ICD-10-CM

## 2014-08-28 DIAGNOSIS — E119 Type 2 diabetes mellitus without complications: Secondary | ICD-10-CM

## 2014-08-28 DIAGNOSIS — M069 Rheumatoid arthritis, unspecified: Secondary | ICD-10-CM | POA: Insufficient documentation

## 2014-08-28 LAB — COMPLETE METABOLIC PANEL WITH GFR
ALBUMIN: 4.3 g/dL (ref 3.5–5.2)
ALK PHOS: 62 U/L (ref 39–117)
ALT: 13 U/L (ref 0–35)
AST: 13 U/L (ref 0–37)
BILIRUBIN TOTAL: 0.5 mg/dL (ref 0.2–1.2)
BUN: 13 mg/dL (ref 6–23)
CO2: 26 meq/L (ref 19–32)
Calcium: 9.3 mg/dL (ref 8.4–10.5)
Chloride: 104 mEq/L (ref 96–112)
Creat: 0.61 mg/dL (ref 0.50–1.10)
GFR, Est African American: >89 mL/min
GFR, Est Non African American: >89 mL/min
GLUCOSE: 144 mg/dL — AB (ref 70–99)
POTASSIUM: 4.1 meq/L (ref 3.5–5.3)
SODIUM: 140 meq/L (ref 135–145)
TOTAL PROTEIN: 7.4 g/dL (ref 6.0–8.3)

## 2014-08-28 LAB — GLUCOSE, POCT (MANUAL RESULT ENTRY): POC Glucose: 136 mg/dl — AB (ref 70–99)

## 2014-08-28 LAB — CBC
HEMATOCRIT: 39.8 % (ref 36.0–46.0)
Hemoglobin: 13.4 g/dL (ref 12.0–15.0)
MCH: 29.4 pg (ref 26.0–34.0)
MCHC: 33.7 g/dL (ref 30.0–36.0)
MCV: 87.3 fL (ref 78.0–100.0)
MPV: 10.1 fL (ref 8.6–12.4)
PLATELETS: 261 10*3/uL (ref 150–400)
RBC: 4.56 MIL/uL (ref 3.87–5.11)
RDW: 13.9 % (ref 11.5–15.5)
WBC: 4.7 10*3/uL (ref 4.0–10.5)

## 2014-08-28 LAB — LIPID PANEL
CHOL/HDL RATIO: 3.6 ratio
Cholesterol: 210 mg/dL — ABNORMAL HIGH (ref 0–200)
HDL: 59 mg/dL (ref >=46)
LDL CALC: 137 mg/dL — AB (ref 0–99)
TRIGLYCERIDES: 72 mg/dL (ref <150)
VLDL: 14 mg/dL (ref 0–40)

## 2014-08-28 LAB — RHEUMATOID FACTOR: Rhuematoid fact SerPl-aCnc: <10 IU/mL (ref <=14)

## 2014-08-28 LAB — POCT GLYCOSYLATED HEMOGLOBIN (HGB A1C): HEMOGLOBIN A1C: 7.3

## 2014-08-28 MED ORDER — LORATADINE 10 MG PO TABS: 10.0000 mg | ORAL_TABLET | Freq: Every day | ORAL | Status: DC

## 2014-08-28 MED ORDER — GLUCOSE BLOOD VI STRP: ORAL_STRIP | Status: DC

## 2014-08-28 MED ORDER — FREESTYLE LANCETS MISC: Status: DC

## 2014-08-28 MED ORDER — METFORMIN HCL ER 500 MG PO TB24: 500.0000 mg | ORAL_TABLET | Freq: Every day | ORAL | Status: DC

## 2014-08-28 MED ORDER — FREESTYLE SYSTEM KIT: 1.0000 | PACK | Status: DC | PRN

## 2014-08-28 NOTE — Progress Notes (Signed)
Pt here to f/u with Pre-diabetes,Knots to right palm area with numbness sensation Sx's started x 1 year ago.  CBG-136, A1c-7.3 [employer] interpretor used for communication

## 2014-08-28 NOTE — Patient Instructions (Signed)
Diabetes and Standards of Medical Care Diabetes is complicated. You may find that your diabetes team includes a dietitian, nurse, diabetes educator, eye doctor, and more. To help everyone know what is going on and to help you get the care you deserve, the following schedule of care was developed to help keep you on track. Below are the tests, exams, vaccines, medicines, education, and plans you will need. HbA1c test This test shows how well you have controlled your glucose over the past 2-3 months. It is used to see if your diabetes management plan needs to be adjusted.   It is performed at least 2 times a year if you are meeting treatment goals.  It is performed 4 times a year if therapy has changed or if you are not meeting treatment goals. Blood pressure test  This test is performed at every routine medical visit. The goal is less than 140/90 mm Hg for most people, but 130/80 mm Hg in some cases. Ask your health care provider about your goal. Dental exam  Follow up with the dentist regularly. Eye exam  If you are diagnosed with type 1 diabetes as a child, get an exam upon reaching the age of 37 years or older and have had diabetes for 3-5 years. Yearly eye exams are recommended after that initial eye exam.  If you are diagnosed with type 1 diabetes as an adult, get an exam within 5 years of diagnosis and then yearly.  If you are diagnosed with type 2 diabetes, get an exam as soon as possible after the diagnosis and then yearly. Foot care exam  Visual foot exams are performed at every routine medical visit. The exams check for cuts, injuries, or other problems with the feet.  A comprehensive foot exam should be done yearly. This includes visual inspection as well as assessing foot pulses and testing for loss of sensation.  Check your feet nightly for cuts, injuries, or other problems with your feet. Tell your health care provider if anything is not healing. Kidney function test (urine  microalbumin)  This test is performed once a year.  Type 1 diabetes: The first test is performed 5 years after diagnosis.  Type 2 diabetes: The first test is performed at the time of diagnosis.  A serum creatinine and estimated glomerular filtration rate (eGFR) test is done once a year to assess the level of chronic kidney disease (CKD), if present. Lipid profile (cholesterol, HDL, LDL, triglycerides)  Performed every 5 years for most people.  The goal for LDL is less than 100 mg/dL. If you are at high risk, the goal is less than 70 mg/dL.  The goal for HDL is 40 mg/dL-50 mg/dL for men and 50 mg/dL-60 mg/dL for women. An HDL cholesterol of 60 mg/dL or higher gives some protection against heart disease.  The goal for triglycerides is less than 150 mg/dL. Influenza vaccine, pneumococcal vaccine, and hepatitis B vaccine  The influenza vaccine is recommended yearly.  It is recommended that people with diabetes who are over 24 years old get the pneumonia vaccine. In some cases, two separate shots may be given. Ask your health care provider if your pneumonia vaccination is up to date.  The hepatitis B vaccine is also recommended for adults with diabetes. Diabetes self-management education  Education is recommended at diagnosis and ongoing as needed. Treatment plan  Your treatment plan is reviewed at every medical visit. Document Released: 03/13/2009 Document Revised: 09/30/2013 Document Reviewed: 10/16/2012 [facility] Patient Information 2015 [place],  [employer]. This information is not intended to replace advice given to you by your health care provider. Make sure you discuss any questions you have with your health care provider.

## 2014-08-28 NOTE — Progress Notes (Signed)
Patient ID: Kristen West, female   DOB: 10-27-1965, 49 y.o.   MRN: [id number]   TDV:[id number]  GGY:[id number]  DOB - [birth year]  CC:  Chief Complaint  Patient presents with  . Follow-up  . Diabetes  . Referral       HPI: Kristen West is a 49 y.o. arabic female here today to establish medical care.  Patient has past medical history of prediabetes and rheumatoid arthritis. Patient reports that she went to see a rheumatologist last April at [facility] but was unable to stay due to expiration of orange card which was two years ago.  She reports that she was tested for rheumatoid arthritis at [facility] a couple of years ago. She states that she has hand/wrist nodules and West for the past 4 years.   Patient reports that she was told that she was prediabetic almost 2 years ago but has not had her levels rechecked since then. She does have a family history of Type 2 Diabetes Mellitus. Patient reports that she has been having blurred vision and some tingling in her toes.   No Known Allergies No past medical history on file. Current Outpatient Prescriptions on File Prior to Visit  Medication Sig Dispense Refill  . diclofenac (VOLTAREN) 50 MG EC tablet Take 1 tablet (50 mg total) by mouth 2 (two) times daily. 30 tablet 0  . [DISCONTINUED] omeprazole (PRILOSEC) 20 MG capsule Take 1 capsule (20 mg total) by mouth daily. 30 capsule 0   No current facility-administered medications on file prior to visit.   No family history on file. History   Social History  . Marital Status: Married    Spouse Name: N/A  . Number of Children: N/A  . Years of Education: N/A   Occupational History  . Not on file.   Social History Main Topics  . Smoking status: Never Smoker   . Smokeless tobacco: Not on file  . Alcohol Use: No  . Drug Use: No  . Sexual Activity: Not on file   Other Topics Concern  . Not on file   Social History Narrative    Review of Systems  Constitutional:  Negative for weight loss.  Eyes: Positive for blurred vision.  Respiratory: Negative.   Cardiovascular: Negative.   Gastrointestinal: Negative.   Neurological: Positive for dizziness, tingling and headaches.  All other systems reviewed and are negative.     Objective:   Filed Vitals:   08/28/14 0929  BP: 117/77  Pulse: 74  Temp: 98.5 F (36.9 C)  Resp: 16    Physical Exam  Constitutional: She is oriented to person, place, and time.  HENT:  Right Ear: External ear normal.  Left Ear: External ear normal.  Mouth/Throat: Oropharynx is clear and moist.  Eyes: EOM are normal. Pupils are equal, round, and reactive to light.  Neck: Normal range of motion. Neck supple.  Cardiovascular: Normal rate, regular rhythm and normal heart sounds.   Pulmonary/Chest: Effort normal and breath sounds normal.  Abdominal: Soft. Bowel sounds are normal.  Musculoskeletal: Normal range of motion. She exhibits no edema.  Lymphadenopathy:    She has no cervical adenopathy.  Neurological: She is alert and oriented to person, place, and time.  Skin: Skin is warm and dry.     Lab Results  Component Value Date   WBC 5.8 12/08/2009   HGB 12.9 12/08/2009   HCT 40.0 12/08/2009   MCV 93.0 12/08/2009   PLT 252 12/08/2009   Lab Results  Component Value Date   CREATININE 0.63 12/08/2009   BUN 10 12/08/2009   NA 139 12/08/2009   K 4.3 12/08/2009   CL 104 12/08/2009   CO2 24 12/08/2009    Lab Results  Component Value Date   HGBA1C 7.30 08/28/2014   Lipid Panel  No results found for: CHOL, TRIG, HDL, CHOLHDL, VLDL, LDLCALC     Assessment and plan:   Anayiah was seen today for follow-up, diabetes and referral.  Diagnoses and all orders for this visit:  Type 2 diabetes mellitus without complication Orders: -     Glucose (CBG) -     HgB A1c -     Lipid panel -     CBC -     COMPLETE METABOLIC PANEL WITH GFR -     Vitamin D, 25-hydroxy -     metFORMIN (GLUCOPHAGE XR) 500 MG 24 hr  tablet; Take 1 tablet (500 mg total) by mouth daily with breakfast. -     glucose monitoring kit (FREESTYLE) monitoring kit; 1 each by Does not apply route as needed. -     glucose blood test strip; Use as instructed -     Lancets (FREESTYLE) lancets; Use as instructed Will begin patient on long acting low dose Metformin. I have explained side effects of diarrhea and abdominal cramping that may persist for the first week of beginning medication.   History of rheumatoid arthritis Orders: -     Rheumatoid factor -     ANA Will recheck, due to no records of diagnoses. Once results return I will send referral for rheumatology.  Other seasonal allergic rhinitis Orders: -     loratadine (CLARITIN) 10 MG tablet; Take 1 tablet (10 mg total) by mouth daily.  Patient and Husband refused to use interpreter services but [place] interpreter phone line was used anyway to make sure there was complete understanding.   Return in about 3 months (around 11/27/2014) for Diabetes Mellitus.    Chari Manning, NP-C [facility] [phone] 08/28/2014, 9:33 AM

## 2014-08-29 LAB — ANA: Anti Nuclear Antibody(ANA): NEGATIVE

## 2014-08-29 LAB — VITAMIN D 25 HYDROXY (VIT D DEFICIENCY, FRACTURES): Vit D, 25-Hydroxy: 14 ng/mL — ABNORMAL LOW (ref 30–100)

## 2014-09-16 ENCOUNTER — Telehealth: Payer: Self-pay | Admitting: *Deleted

## 2014-09-16 MED ORDER — ATORVASTATIN CALCIUM 10 MG PO TABS: 10.0000 mg | ORAL_TABLET | Freq: Every day | ORAL | Status: DC

## 2014-09-16 MED ORDER — ERGOCALCIFEROL 1.25 MG (50000 UT) PO CAPS: 50000.0000 [IU] | ORAL_CAPSULE | ORAL | Status: DC

## 2014-09-16 NOTE — Telephone Encounter (Signed)
Pt is aware of her lab results.  

## 2014-09-16 NOTE — Telephone Encounter (Signed)
-----   Message from Ambrose Finland, NP sent at 09/10/2014  5:56 PM EDT ----- ch9olesterol is elevated and due to her being a diabetic I would like to start her on medication to help lower risk of stroke and heart disease. Please send atorvastatin 10 mg to take daily.   Vitamin D is low. Please send drisdol 50,000 IU to take once weekly for 12 weeks. 12 tablets no refills. No rheumatoid arthritis

## 2015-02-18 ENCOUNTER — Ambulatory Visit: Payer: Self-pay | Attending: Internal Medicine

## 2015-05-07 ENCOUNTER — Ambulatory Visit: Payer: Self-pay

## 2015-11-24 ENCOUNTER — Ambulatory Visit: Payer: Self-pay

## 2015-11-27 ENCOUNTER — Ambulatory Visit: Payer: Self-pay | Attending: Internal Medicine

## 2016-02-22 ENCOUNTER — Encounter: Payer: Self-pay | Admitting: Family Medicine

## 2016-02-22 ENCOUNTER — Ambulatory Visit: Payer: Self-pay | Attending: Family Medicine | Admitting: Family Medicine

## 2016-02-22 ENCOUNTER — Encounter (INDEPENDENT_AMBULATORY_CARE_PROVIDER_SITE_OTHER): Payer: Self-pay

## 2016-02-22 VITALS — BP 121/77 | HR 86 | Temp 97.1°F | Resp 16 | Ht <= 58 in | Wt 127.0 lb

## 2016-02-22 DIAGNOSIS — E1142 Type 2 diabetes mellitus with diabetic polyneuropathy: Secondary | ICD-10-CM | POA: Insufficient documentation

## 2016-02-22 DIAGNOSIS — R05 Cough: Secondary | ICD-10-CM | POA: Insufficient documentation

## 2016-02-22 DIAGNOSIS — IMO0002 Reserved for concepts with insufficient information to code with codable children: Secondary | ICD-10-CM | POA: Insufficient documentation

## 2016-02-22 DIAGNOSIS — E1165 Type 2 diabetes mellitus with hyperglycemia: Secondary | ICD-10-CM | POA: Insufficient documentation

## 2016-02-22 DIAGNOSIS — B353 Tinea pedis: Secondary | ICD-10-CM | POA: Insufficient documentation

## 2016-02-22 DIAGNOSIS — Z7984 Long term (current) use of oral hypoglycemic drugs: Secondary | ICD-10-CM | POA: Insufficient documentation

## 2016-02-22 DIAGNOSIS — R059 Cough, unspecified: Secondary | ICD-10-CM

## 2016-02-22 DIAGNOSIS — R200 Anesthesia of skin: Secondary | ICD-10-CM | POA: Insufficient documentation

## 2016-02-22 DIAGNOSIS — K219 Gastro-esophageal reflux disease without esophagitis: Secondary | ICD-10-CM | POA: Insufficient documentation

## 2016-02-22 DIAGNOSIS — R202 Paresthesia of skin: Secondary | ICD-10-CM | POA: Insufficient documentation

## 2016-02-22 DIAGNOSIS — Z23 Encounter for immunization: Secondary | ICD-10-CM

## 2016-02-22 LAB — POCT GLYCOSYLATED HEMOGLOBIN (HGB A1C): Hemoglobin A1C: 9.4

## 2016-02-22 LAB — GLUCOSE, POCT (MANUAL RESULT ENTRY): POC GLUCOSE: 202 mg/dL — AB (ref 70–99)

## 2016-02-22 MED ORDER — GLIMEPIRIDE 2 MG PO TABS: 2.0000 mg | ORAL_TABLET | Freq: Every day | ORAL | 5 refills | Status: DC

## 2016-02-22 MED ORDER — METFORMIN HCL ER 500 MG PO TB24: 1000.0000 mg | ORAL_TABLET | Freq: Every day | ORAL | 5 refills | Status: DC

## 2016-02-22 MED ORDER — RANITIDINE HCL 300 MG PO TABS: 300.0000 mg | ORAL_TABLET | Freq: Every day | ORAL | 0 refills | Status: DC

## 2016-02-22 MED ORDER — KETOCONAZOLE 2 % EX CREA: 1.0000 "application " | TOPICAL_CREAM | Freq: Every day | CUTANEOUS | 0 refills | Status: DC

## 2016-02-22 MED FILL — ?GLIMEPIRIDE 2 MG TABLET: 2 | 30 days supply | Qty: 30 | Fill #0

## 2016-02-22 MED FILL — METFORMIN HCL ER 500 MG TAB: 500 | 30 days supply | Qty: 60 | Fill #0

## 2016-02-22 MED FILL — KETOCONAZOLE 2% CREAM: 2 | 20 days supply | Qty: 15 | Fill #0

## 2016-02-22 MED FILL — raNITIdine HCL 150 MG TABS: 150 | 30 days supply | Qty: 60 | Fill #0

## 2016-02-22 NOTE — Progress Notes (Addendum)
Subjective:  Patient ID: Kristen West, female    DOB: 03-31-1966  Age: 50 y.o. MRN: [id number]  CC: Cough   HPI Kristen West presents for   1. Diabetes: diagnosed in 2011. A1c was 7.1 > 6.8 > 7.3 (last check 08/28/2014). She dose not take medication for diabetes. She checks her CBGs at home range is 100-150. She denies vision changes,   2. Cough: x 2 months. chronic non smoker. No hx of asthma. Cough is non productive.   3. Numbness in fingers: x one year, R hand, thumb and 2nd and 3rd finger. 6 years ago she thenar wasting on the R side. This thenar wasting was evaluated with what sounds like a nerve conduction test and treated with PT without improvement.   4. Foot rash: for many years. Dry, scaly and itchy skin on dorsum of both feet.   Social History  Substance Use Topics  . Smoking status: Never Smoker  . Smokeless tobacco: Not on file  . Alcohol use No   No past medical history on file.   Outpatient Medications Prior to Visit  Medication Sig Dispense Refill  . glucose blood test strip Use as instructed 100 each 12  . glucose monitoring kit (FREESTYLE) monitoring kit 1 each by Does not apply route as needed. 1 each 0  . Lancets (FREESTYLE) lancets Use as instructed 100 each 12  . atorvastatin (LIPITOR) 10 MG tablet Take 1 tablet (10 mg total) by mouth daily. (Patient not taking: Reported on 02/22/2016) 90 tablet 3  . ergocalciferol (DRISDOL) 50000 UNITS capsule Take 1 capsule (50,000 Units total) by mouth once a week. (Patient not taking: Reported on 02/22/2016) 12 capsule 0  . loratadine (CLARITIN) 10 MG tablet Take 1 tablet (10 mg total) by mouth daily. (Patient not taking: Reported on 02/22/2016) 30 tablet 11  . metFORMIN (GLUCOPHAGE XR) 500 MG 24 hr tablet Take 1 tablet (500 mg total) by mouth daily with breakfast. (Patient not taking: Reported on 02/22/2016) 30 tablet 4   No facility-administered medications prior to visit.     ROS Review of Systems    Constitutional: Negative for chills and fever.  Eyes: Negative for visual disturbance.  Respiratory: Positive for cough. Negative for shortness of breath.   Cardiovascular: Negative for chest West.  Gastrointestinal: Negative for abdominal West and blood in stool.  Musculoskeletal: Negative for arthralgias and back West.  Skin: Negative for rash.  Allergic/Immunologic: Negative for immunocompromised state.  Neurological: Positive for numbness (numbness in fingers of R hand thumb and first two fingers ).  Hematological: Negative for adenopathy. Does not bruise/bleed easily.  Psychiatric/Behavioral: Negative for dysphoric mood and suicidal ideas.    Objective:  BP 121/77 (BP Location: Right Arm, Patient Position: Sitting, Cuff Size: Normal)   Pulse 86   Temp 97.1 F (36.2 C) (Oral)   Resp 16   Ht '4\' 7"'  (1.397 m)   Wt 127 lb (57.6 kg)   SpO2 98%   BMI 29.52 kg/m   BP/Weight 02/22/2016 08/28/2014 57/05/7791  Systolic BP 903 009 233  Diastolic BP 77 77 66  Wt. (Lbs) 127 119.6 -  BMI 29.52 26.83 -   Physical Exam  Constitutional: She is oriented to person, place, and time. She appears well-developed and well-nourished. No distress.  HENT:  Head: Normocephalic and atraumatic.  Nose: Mucosal edema present.  Cardiovascular: Normal rate, regular rhythm, normal heart sounds and intact distal pulses.   Pulmonary/Chest: Effort normal and breath sounds normal.  Musculoskeletal:  She exhibits no edema.       Feet:  Neurological: She is alert and oriented to person, place, and time.  Skin: Skin is warm and dry. No rash noted.  Psychiatric: She has a normal mood and affect.    Lab Results  Component Value Date   HGBA1C 7.30 08/28/2014   Lab Results  Component Value Date   HGBA1C 9.4 02/22/2016    CBG 202 Assessment & Plan:   Vestal was seen today for cough.  Diagnoses and all orders for this visit:  Flu vaccine need -     Flu Vaccine QUAD 36+ mos IM  Cough -      ranitidine (ZANTAC) 300 MG tablet; Take 1 tablet (300 mg total) by mouth at bedtime.  Tinea pedis of both feet -     ketoconazole (NIZORAL) 2 % cream; Apply 1 application topically daily.  Numbness and tingling in right hand -     Ambulatory referral to Neurology  Uncontrolled type 2 diabetes mellitus with diabetic polyneuropathy, without long-term current use of insulin (HCC) -     HgB A1c -     Glucose (CBG) -     Microalbumin/Creatinine Ratio, Urine -     metFORMIN (GLUCOPHAGE XR) 500 MG 24 hr tablet; Take 2 tablets (1,000 mg total) by mouth daily with breakfast. -     glimepiride (AMARYL) 2 MG tablet; Take 1 tablet (2 mg total) by mouth daily before breakfast. -     Ambulatory referral to Ophthalmology   No orders of the defined types were placed in this encounter.   Follow-up: No Follow-up on file.   Boykin Nearing MD

## 2016-02-22 NOTE — Progress Notes (Signed)
C/C coughing x two month  Feet dry (bilateral) and itchy On lt foot  Dry cough No tobacco user  No suicidal  thoughts in the past two weeks No pain today  Pre-DM Glucose at home 100-150 not taking medication

## 2016-02-22 NOTE — Assessment & Plan Note (Signed)
Tinea pedis nizoral cream

## 2016-02-22 NOTE — Patient Instructions (Addendum)
Kristen West was seen today for cough.  Diagnoses and all orders for this visit:  Flu vaccine need -     Flu Vaccine QUAD 36+ mos IM  Cough -     ranitidine (ZANTAC) 300 MG tablet; Take 1 tablet (300 mg total) by mouth at bedtime.  Tinea pedis of both feet -     ketoconazole (NIZORAL) 2 % cream; Apply 1 application topically daily.  Numbness and tingling in right hand -     Ambulatory referral to Neurology  Uncontrolled type 2 diabetes mellitus with diabetic polyneuropathy, without long-term current use of insulin (HCC) -     HgB A1c -     Glucose (CBG) -     Microalbumin/Creatinine Ratio, Urine -     metFORMIN (GLUCOPHAGE XR) 500 MG 24 hr tablet; Take 2 tablets (1,000 mg total) by mouth daily with breakfast. -     glimepiride (AMARYL) 2 MG tablet; Take 1 tablet (2 mg total) by mouth daily before breakfast. -     Ambulatory referral to Ophthalmology  Diabetes blood sugar goals  Fasting (in AM before breakfast, 8 hrs of no eating or drinking (except water or unsweetened coffee or tea): 90-110 2 hrs after meals: < 160,   No low sugars: nothing < 70    F/u in 2 weeks for cough and diabetes   Dr. Armen Pickup

## 2016-02-22 NOTE — Assessment & Plan Note (Signed)
numbness in R hand x one year in setting of diabetes. Distribution is consistent with carpal tunnel  Plan: Treat diabetes Wrist splint Neurology referral as patient also has thenar wasting on R side

## 2016-02-22 NOTE — Assessment & Plan Note (Signed)
Cough x 2 months  I suspect GERD giving the timing of the cough The is possibly a degree of allergy given swollen nasal turbinates  Plan: Two week trial of zantac Close f/u Plan for H2 blocker

## 2016-02-22 NOTE — Assessment & Plan Note (Signed)
Uncontrolled diabetes with neuropathic symptoms  Adding metformin Adding amaryl CBG monitoring

## 2016-02-23 LAB — MICROALBUMIN / CREATININE URINE RATIO
Creatinine, Urine: 35 mg/dL (ref 20–320)
MICROALB UR: 0.3 mg/dL
MICROALB/CREAT RATIO: 9 ug/mg{creat} (ref <30)

## 2016-03-01 NOTE — Progress Notes (Signed)
Call placed to [facility] interpreter, rep ID [phone] assisted with call translation. Patient hipaa verif;  Rn advised patient per Dr. Armen Pickup:  Normal urine microalbumin

## 2016-03-07 ENCOUNTER — Ambulatory Visit: Payer: Self-pay | Attending: Family Medicine | Admitting: Family Medicine

## 2016-03-07 ENCOUNTER — Encounter: Payer: Self-pay | Admitting: Family Medicine

## 2016-03-07 VITALS — BP 132/82 | HR 83 | Temp 97.6°F | Ht <= 58 in | Wt 123.8 lb

## 2016-03-07 DIAGNOSIS — Z7984 Long term (current) use of oral hypoglycemic drugs: Secondary | ICD-10-CM | POA: Insufficient documentation

## 2016-03-07 DIAGNOSIS — IMO0001 Reserved for inherently not codable concepts without codable children: Secondary | ICD-10-CM

## 2016-03-07 DIAGNOSIS — E1165 Type 2 diabetes mellitus with hyperglycemia: Secondary | ICD-10-CM | POA: Insufficient documentation

## 2016-03-07 DIAGNOSIS — Z79899 Other long term (current) drug therapy: Secondary | ICD-10-CM | POA: Insufficient documentation

## 2016-03-07 DIAGNOSIS — K210 Gastro-esophageal reflux disease with esophagitis: Secondary | ICD-10-CM | POA: Insufficient documentation

## 2016-03-07 DIAGNOSIS — M79622 Pain in left upper arm: Secondary | ICD-10-CM | POA: Insufficient documentation

## 2016-03-07 DIAGNOSIS — K219 Gastro-esophageal reflux disease without esophagitis: Secondary | ICD-10-CM

## 2016-03-07 LAB — GLUCOSE, POCT (MANUAL RESULT ENTRY): POC Glucose: 159 mg/dl — AB (ref 70–99)

## 2016-03-07 MED ORDER — RANITIDINE HCL 300 MG PO TABS: 300.0000 mg | ORAL_TABLET | Freq: Every day | ORAL | 11 refills | Status: DC

## 2016-03-07 MED ORDER — MULTIVITAMIN ADULTS PO TABS: 1.0000 | ORAL_TABLET | Freq: Every day | ORAL | 11 refills | Status: DC

## 2016-03-07 MED ORDER — NAPROXEN 500 MG PO TABS: 500.0000 mg | ORAL_TABLET | Freq: Two times a day (BID) | ORAL | 2 refills | Status: DC

## 2016-03-07 MED FILL — NAPROXEN 500 MG TABLET: 500 | 15 days supply | Qty: 30 | Fill #0

## 2016-03-07 NOTE — Assessment & Plan Note (Signed)
A: L upper arm pain following fall. No evidence fracture, dislocation or cervical radiculopathy P: NSAID Home PT F/u in 6 weeks with plan for outpatient PT if symptoms worsen or fail to improve

## 2016-03-07 NOTE — Assessment & Plan Note (Signed)
Cough has improved with zantac which confirms GERD Plan: Avoid known triggers Continue zantac

## 2016-03-07 NOTE — Patient Instructions (Addendum)
Kristen West was seen today for cough and arm pain.  Diagnoses and all orders for this visit:  Uncontrolled type 2 diabetes mellitus without complication, without long-term current use of insulin (HCC) -     POCT glucose (manual entry) -     Multiple Vitamins-Minerals (MULTIVITAMIN ADULTS) TABS; Take 1 tablet by mouth daily.  Left upper arm pain -     naproxen (NAPROSYN) 500 MG tablet; Take 1 tablet (500 mg total) by mouth 2 (two) times daily with a meal.  Gastroesophageal reflux disease, esophagitis presence not specified -     ranitidine (ZANTAC) 300 MG tablet; Take 1 tablet (300 mg total) by mouth at bedtime.    F/u in 6 weeks for L arm pain following fall   Dr. Armen Pickup    Generic Shoulder Exercises EXERCISES  RANGE OF MOTION (ROM) AND STRETCHING EXERCISES These exercises may help you when beginning to rehabilitate your injury. Your symptoms may resolve with or without further involvement from your physician, physical therapist or athletic trainer. While completing these exercises, remember:   Restoring tissue flexibility helps normal motion to return to the joints. This allows healthier, less painful movement and activity.  An effective stretch should be held for at least 30 seconds.  A stretch should never be painful. You should only feel a gentle lengthening or release in the stretched tissue. ROM - Pendulum  Bend at the waist so that your right / left arm falls away from your body. Support yourself with your opposite hand on a solid surface, such as a table or a countertop.  Your right / left arm should be perpendicular to the ground. If it is not perpendicular, you need to lean over farther. Relax the muscles in your right / left arm and shoulder as much as possible.  Gently sway your hips and trunk so they move your right / left arm without any use of your right / left shoulder muscles.  Progress your movements so that your right / left arm moves side to side, then forward  and backward, and finally, both clockwise and counterclockwise.  Complete __________ repetitions in each direction. Many people use this exercise to relieve discomfort in their shoulder as well as to gain range of motion. Repeat __________ times. Complete this exercise __________ times per day. STRETCH - Flexion, Standing  Stand with good posture. With an underhand grip on your right / left hand and an overhand grip on the opposite hand, grasp a broomstick or cane so that your hands are a little more than shoulder-width apart.  Keeping your right / left elbow straight and shoulder muscles relaxed, push the stick with your opposite hand to raise your right / left arm in front of your body and then overhead. Raise your arm until you feel a stretch in your right / left shoulder, but before you have increased shoulder pain.  Try to avoid shrugging your right / left shoulder as your arm rises by keeping your shoulder blade tucked down and toward your mid-back spine. Hold __________ seconds.  Slowly return to the starting position. Repeat __________ times. Complete this exercise __________ times per day. STRETCH - Internal Rotation  Place your right / left hand behind your back, palm-up.  Throw a towel or belt over your opposite shoulder. Grasp the towel/belt with your right / left hand.  While keeping an upright posture, gently pull up on the towel/belt until you feel a stretch in the front of your right / left shoulder.  Avoid shrugging your right / left shoulder as your arm rises by keeping your shoulder blade tucked down and toward your mid-back spine.  Hold __________. Release the stretch by lowering your opposite hand. Repeat __________ times. Complete this exercise __________ times per day. STRETCH - External Rotation and Abduction  Stagger your stance through a doorframe. It does not matter which foot is forward.  As instructed by your physician, physical therapist or athletic  trainer, place your hands:  And forearms above your head and on the door frame.  And forearms at head-height and on the door frame.  At elbow-height and on the door frame.  Keeping your head and chest upright and your stomach muscles tight to prevent over-extending your low-back, slowly shift your weight onto your front foot until you feel a stretch across your chest and/or in the front of your shoulders.  Hold __________ seconds. Shift your weight to your back foot to release the stretch. Repeat __________ times. Complete this stretch __________ times per day.  STRENGTHENING EXERCISES  These exercises may help you when beginning to rehabilitate your injury. They may resolve your symptoms with or without further involvement from your physician, physical therapist or athletic trainer. While completing these exercises, remember:   Muscles can gain both the endurance and the strength needed for everyday activities through controlled exercises.  Complete these exercises as instructed by your physician, physical therapist or athletic trainer. Progress the resistance and repetitions only as guided.  You may experience muscle soreness or fatigue, but the pain or discomfort you are trying to eliminate should never worsen during these exercises. If this pain does worsen, stop and make certain you are following the directions exactly. If the pain is still present after adjustments, discontinue the exercise until you can discuss the trouble with your clinician.  If advised by your physician, during your recovery, avoid activity or exercises which involve actions that place your right / left hand or elbow above your head or behind your back or head. These positions stress the tissues which are trying to heal. STRENGTH - Scapular Depression and Adduction  With good posture, sit on a firm chair. Supported your arms in front of you with pillows, arm rests or a table top. Have your elbows in line with the  sides of your body.  Gently draw your shoulder blades down and toward your mid-back spine. Gradually increase the tension without tensing the muscles along the top of your shoulders and the back of your neck.  Hold for __________ seconds. Slowly release the tension and relax your muscles completely before completing the next repetition.  After you have practiced this exercise, remove the arm support and complete it in standing as well as sitting. Repeat __________ times. Complete this exercise __________ times per day.  STRENGTH - External Rotators  Secure a rubber exercise band/tubing to a fixed object so that it is at the same height as your right / left elbow when you are standing or sitting on a firm surface.  Stand or sit so that the secured exercise band/tubing is at your side that is not injured.  Bend your elbow 90 degrees. Place a folded towel or small pillow under your right / left arm so that your elbow is a few inches away from your side.  Keeping the tension on the exercise band/tubing, pull it away from your body, as if pivoting on your elbow. Be sure to keep your body steady so that the movement is only coming  from your shoulder rotating.  Hold __________ seconds. Release the tension in a controlled manner as you return to the starting position. Repeat __________ times. Complete this exercise __________ times per day.  STRENGTH - Supraspinatus  Stand or sit with good posture. Grasp a __________ weight or an exercise band/tubing so that your hand is "thumbs-up," like when you shake hands.  Slowly lift your right / left hand from your thigh into the air, traveling about 30 degrees from straight out at your side. Lift your hand to shoulder height or as far as you can without increasing any shoulder pain. Initially, many people do not lift their hands above shoulder height.  Avoid shrugging your right / left shoulder as your arm rises by keeping your shoulder blade tucked down  and toward your mid-back spine.  Hold for __________ seconds. Control the descent of your hand as you slowly return to your starting position. Repeat __________ times. Complete this exercise __________ times per day.  STRENGTH - Shoulder Extensors  Secure a rubber exercise band/tubing so that it is at the height of your shoulders when you are either standing or sitting on a firm arm-less chair.  With a thumbs-up grip, grasp an end of the band/tubing in each hand. Straighten your elbows and lift your hands straight in front of you at shoulder height. Step back away from the secured end of band/tubing until it becomes tense.  Squeezing your shoulder blades together, pull your hands down to the sides of your thighs. Do not allow your hands to go behind you.  Hold for __________ seconds. Slowly ease the tension on the band/tubing as you reverse the directions and return to the starting position. Repeat __________ times. Complete this exercise __________ times per day.  STRENGTH - Scapular Retractors  Secure a rubber exercise band/tubing so that it is at the height of your shoulders when you are either standing or sitting on a firm arm-less chair.  With a palm-down grip, grasp an end of the band/tubing in each hand. Straighten your elbows and lift your hands straight in front of you at shoulder height. Step back away from the secured end of band/tubing until it becomes tense.  Squeezing your shoulder blades together, draw your elbows back as you bend them. Keep your upper arm lifted away from your body throughout the exercise.  Hold __________ seconds. Slowly ease the tension on the band/tubing as you reverse the directions and return to the starting position. Repeat __________ times. Complete this exercise __________ times per day. STRENGTH - Scapular Depressors  Find a sturdy chair without wheels, such as a from a dining room table.  Keeping your feet on the floor, lift your bottom from the  seat and lock your elbows.  Keeping your elbows straight, allow gravity to pull your body weight down. Your shoulders will rise toward your ears.  Raise your body against gravity by drawing your shoulder blades down your back, shortening the distance between your shoulders and ears. Although your feet should always maintain contact with the floor, your feet should progressively support less body weight as you get stronger.  Hold __________ seconds. In a controlled and slow manner, lower your body weight to begin the next repetition. Repeat __________ times. Complete this exercise __________ times per day.    This information is not intended to replace advice given to you by your health care provider. Make sure you discuss any questions you have with your health care provider.   Document Released:  03/30/2005 Document Revised: 06/06/2014 Document Reviewed: 08/28/2008 Elsevier Interactive Patient Education [employer]

## 2016-03-07 NOTE — Progress Notes (Signed)
Subjective:  Patient ID: Kristen West, female    DOB: March 03, 1966  Age: 50 y.o. MRN: [id number]  CC: Diabetes   HPI Kristen West has diabetes she presents for   1. F/u cough: improved. Taking nightly zantac. No CP or SOB. She eats some citrus and spicy foods but not too often.   2. Fall with arm West: she feel off the back of the cough while dusting 1 week ago. She feel onto her L upper arm. She is R handed. She denies head trauma or injury to Kristen other part of her body. She did not have bruising or inability to move. She has L upper arm West immediately. West is improving but persist. She iced her arm and used tylenol for the first few days. She denies weakness or West in her hand or wrist. She has West in L upper and posterior arm down to forearm. She has West in her L elbow but no swelling. Current West level is 8/10.  Marland Kitchen Social History  Substance Use Topics  . Smoking status: Never Smoker  . Smokeless tobacco: Not on file  . Alcohol use No   Outpatient Medications Prior to Visit  Medication Sig Dispense Refill  . glimepiride (AMARYL) 2 MG tablet Take 1 tablet (2 mg total) by mouth daily before breakfast. 30 tablet 5  . glucose blood test strip Use as instructed 100 each 12  . glucose monitoring kit (FREESTYLE) monitoring kit 1 each by Does not apply route as needed. 1 each 0  . ketoconazole (NIZORAL) 2 % cream Apply 1 application topically daily. 15 g 0  . Lancets (FREESTYLE) lancets Use as instructed 100 each 12  . metFORMIN (GLUCOPHAGE XR) 500 MG 24 hr tablet Take 2 tablets (1,000 mg total) by mouth daily with breakfast. 60 tablet 5  . ranitidine (ZANTAC) 300 MG tablet Take 1 tablet (300 mg total) by mouth at bedtime. 30 tablet 0   No facility-administered medications prior to visit.     ROS Review of Systems  Constitutional: Negative for chills and fever.  Eyes: Negative for visual disturbance.  Respiratory: Negative for cough and shortness of breath.     Cardiovascular: Negative for chest West.  Gastrointestinal: Negative for abdominal West and blood in stool.  Musculoskeletal: Positive for arthralgias and myalgias. Negative for back West and joint swelling.  Skin: Negative for rash.  Allergic/Immunologic: Negative for immunocompromised state.  Neurological: Positive for numbness (numbness in fingers of R hand thumb and first two fingers ).  Hematological: Negative for adenopathy. Does not bruise/bleed easily.  Psychiatric/Behavioral: Negative for dysphoric mood and suicidal ideas.    Objective:  BP 132/82 (BP Location: Right Arm, Patient Position: Sitting, Cuff Size: Small)   Pulse 83   Temp 97.6 F (36.4 C) (Oral)   Ht '4\' 7"'  (1.397 m)   Wt 123 lb 12.8 oz (56.2 kg)   SpO2 98%   BMI 28.77 kg/m   BP/Weight 03/07/2016 02/22/2016 5/68/6168  Systolic BP 372 902 111  Diastolic BP 82 77 77  Wt. (Lbs) 123.8 127 119.6  BMI 28.77 29.52 26.83   Physical Exam  Constitutional: She is oriented to person, place, and time. She appears well-developed and well-nourished. No distress.  HENT:  Head: Normocephalic and atraumatic.  Cardiovascular: Normal rate, regular rhythm, normal heart sounds and intact distal pulses.   Pulmonary/Chest: Effort normal and breath sounds normal.  Musculoskeletal: She exhibits no edema.       Left shoulder: She exhibits tenderness (  triceps tender ) and West. She exhibits normal range of motion, no bony tenderness, no swelling, no effusion, no crepitus, no deformity, no laceration, no spasm, normal pulse and normal strength.  Neurological: She is alert and oriented to person, place, and time.  Skin: Skin is warm and dry. No rash noted.  Psychiatric: She has a normal mood and affect.   Lab Results  Component Value Date   HGBA1C 9.4 02/22/2016   CBG 159   Assessment & Plan:   Kristen West was seen today for cough and arm West.  Diagnoses and all orders for this visit:  Uncontrolled type 2 diabetes mellitus without  complication, without long-term current use of insulin (HCC) -     POCT glucose (manual entry) -     Multiple Vitamins-Minerals (MULTIVITAMIN ADULTS) TABS; Take 1 tablet by mouth daily.  Left upper arm West -     naproxen (NAPROSYN) 500 MG tablet; Take 1 tablet (500 mg total) by mouth 2 (two) times daily with a meal.  Gastroesophageal reflux disease, esophagitis presence not specified -     ranitidine (ZANTAC) 300 MG tablet; Take 1 tablet (300 mg total) by mouth at bedtime.   No orders of the defined types were placed in this encounter.   Follow-up: Return in about 6 weeks (around 04/18/2016) for L arm West .   Boykin Nearing MD

## 2016-03-07 NOTE — Progress Notes (Signed)
Pt fell 1 week ago and has pain in left arm.  Flu complete.

## 2016-04-25 ENCOUNTER — Encounter: Payer: Self-pay | Admitting: Neurology

## 2016-04-25 ENCOUNTER — Ambulatory Visit (INDEPENDENT_AMBULATORY_CARE_PROVIDER_SITE_OTHER): Payer: Self-pay | Admitting: Neurology

## 2016-04-25 VITALS — BP 120/80 | HR 88 | Ht <= 58 in | Wt 122.5 lb

## 2016-04-25 DIAGNOSIS — M62541 Muscle wasting and atrophy, not elsewhere classified, right hand: Secondary | ICD-10-CM

## 2016-04-25 DIAGNOSIS — G5603 Carpal tunnel syndrome, bilateral upper limbs: Secondary | ICD-10-CM

## 2016-04-25 NOTE — Progress Notes (Signed)
[facility] Clinic Note - Initial Visit   Date: 04/25/16  Kristen West MRN: [id number] DOB: 1965-10-08   Dear Dr. Adrian Blackwater:  Thank you for your kind referral of Kristen West for consultation of right hand weakness. Although her history is well known to you, please allow [place] to reiterate it for the purpose of our medical record. The patient was accompanied to the clinic by husband who also provides collateral information.     History of Present Illness: Kristen West is a 50 y.o. right-handed female with diabetes mellitus and GERD presenting for evaluation of bilateral hand weakness.    Starting around 2012, she has noticed numbness over the right thumb, index finger, and middle finger.  Numbness is constant and worse at night time.  She denies weakness of the hands, but noticed reduced muscle bulk at the base of the hand. She tried using a wrist splint, but does not recall much improvement.  Over the past few years, she began to notice similar symptoms of the left hand.  She has previously had NCS/EMG performed at [facility], but the results are not available.  From what they say, it sounds like she was diagnosed with carpal tunnel syndrome and recommended to have surgery, but because there is no 100% gaurantee this would fix the problem, they elected to hold off.  Unfortunately, her health insurance was lost and she did not seek follow-up regarding this issue again.  She also complains of achy left upper arm pain after sustained a fall a few months back.  She has tried ice and tramadol, with no improvement.    Past Surgical History:  Procedure Laterality Date  . CESAREAN SECTION    . HERNIA REPAIR       Medications:  Outpatient Encounter Prescriptions as of 04/25/2016  Medication Sig Note  . glimepiride (AMARYL) 2 MG tablet Take 1 tablet (2 mg total) by mouth daily before breakfast.   . glucose blood test strip Use as instructed   .  glucose monitoring kit (FREESTYLE) monitoring kit 1 each by Does not apply route as needed.   Marland Kitchen ketoconazole (NIZORAL) 2 % cream Apply 1 application topically daily.   . Lancets (FREESTYLE) lancets Use as instructed   . metFORMIN (GLUCOPHAGE XR) 500 MG 24 hr tablet Take 2 tablets (1,000 mg total) by mouth daily with breakfast.   . Multiple Vitamins-Minerals (MULTIVITAMIN ADULTS) TABS Take 1 tablet by mouth daily.   . naproxen (NAPROSYN) 500 MG tablet Take 1 tablet (500 mg total) by mouth 2 (two) times daily with a meal.   . ranitidine (ZANTAC) 300 MG tablet Take 1 tablet (300 mg total) by mouth at bedtime.   . traMADol (ULTRAM) 50 MG tablet 1 po q 12 hours prn shoulder pain 04/25/2016: Received from: [facility]   No facility-administered encounter medications on file as of 04/25/2016.      Allergies: No Known Allergies  Family History: Family History  Problem Relation Age of Onset  . Diabetes Father   . Diabetes Sister     Social History: Social History  Substance Use Topics  . Smoking status: Never Smoker  . Smokeless tobacco: Never Used  . Alcohol use No   Social History   Social History Narrative   Lives with husband in a 2 story townhouse.  Has 2 children.  Works at a nursing home?  Education: high school.    Review of Systems:  CONSTITUTIONAL: No fevers, chills,  night sweats, or weight loss.   EYES: No visual changes or eye pain ENT: No hearing changes.  No history of nose bleeds.   RESPIRATORY: No cough, wheezing and shortness of breath.   CARDIOVASCULAR: Negative for chest pain, and palpitations.   GI: Negative for abdominal discomfort, blood in stools or black stools.  No recent change in bowel habits.   GU:  No history of incontinence.   MUSCLOSKELETAL: No history of joint pain or swelling.  + myalgias.   SKIN: Negative for lesions, rash, and itching.   HEMATOLOGY/ONCOLOGY: Negative for prolonged bleeding, bruising easily, and swollen  nodes.  No history of cancer.   ENDOCRINE: Negative for cold or heat intolerance, polydipsia or goiter.   PSYCH:  No depression or anxiety symptoms.   NEURO: As Above.   Vital Signs:  BP 120/80   Pulse 88   Ht '4\' 7"'  (1.397 m)   Wt 122 lb 8 oz (55.6 kg)   SpO2 97%   BMI 28.47 kg/m   General Medical Exam:   General:  Well appearing, comfortable.   Eyes/ENT: see cranial nerve examination.   Neck: No masses appreciated.  Full range of motion without tenderness.  No carotid bruits. Respiratory:  Clear to auscultation, good air entry bilaterally.   Cardiac:  Regular rate and rhythm, no murmur.   Extremities:  No deformities, edema, or skin discoloration.  Skin:  No rashes or lesions.  Neurological Exam: MENTAL STATUS including orientation to time, place, person, recent and remote memory, attention span and concentration, language, and fund of knowledge is normal.  Speech is not dysarthric.  CRANIAL NERVES: II:  No visual field defects.  Unremarkable fundi.   III-IV-VI: Pupils equal round and reactive to light.  Normal conjugate, extra-ocular eye movements in all directions of gaze.  No nystagmus.  No ptosis.   V:  Normal facial sensation.     VII:  Normal facial symmetry and movements.   VIII:  Normal hearing and vestibular function.   IX-X:  Normal palatal movement.   XI:  Normal shoulder shrug and head rotation.   XII:  Normal tongue strength and range of motion, no deviation or fasciculation.  MOTOR:  Moderate ABP atrophy on the right, mild on the left.  No fasciculations or abnormal movements.  No pronator drift.  Tone is normal.    Right Upper Extremity:    Left Upper Extremity:    Deltoid  5/5   Deltoid  5/5   Biceps  5/5   Biceps  5/5   Triceps  5/5   Triceps  5/5   Wrist extensors  5/5   Wrist extensors  5/5   Wrist flexors  5/5   Wrist flexors  5/5   Finger extensors  5/5   Finger extensors  5/5   Finger flexors  5/5   Finger flexors  5/5   Dorsal interossei  5/5    Dorsal interossei  5/5   Abductor pollicis  3/5   Abductor pollicis  5-/5   Tone (Ashworth scale)  0  Tone (Ashworth scale)  0   Right Lower Extremity:    Left Lower Extremity:    Hip flexors  5/5   Hip flexors  5/5   Hip extensors  5/5   Hip extensors  5/5   Knee flexors  5/5   Knee flexors  5/5   Knee extensors  5/5   Knee extensors  5/5   Dorsiflexors  5/5   Dorsiflexors  5/5  Plantarflexors  5/5   Plantarflexors  5/5   Toe extensors  5/5   Toe extensors  5/5   Toe flexors  5/5   Toe flexors  5/5   Tone (Ashworth scale)  0  Tone (Ashworth scale)  0   MSRs:  Right                                                                 Left brachioradialis 2+  brachioradialis 2+  biceps 2+  biceps 2+  triceps 2+  triceps 2+  patellar 2+  patellar 2+  ankle jerk 2+  ankle jerk 2+  Hoffman no  Hoffman no  plantar response down  plantar response down   SENSORY:  Reduced temperature and pin prick over the median distribution on the right only.  Normal and symmetric perception of light touch, pinprick, vibration in the legs.   COORDINATION/GAIT: Normal finger-to- nose-finger.  Intact rapid alternating movements bilaterally.   Gait narrow based and stable.   IMPRESSION: 1.  Bilateral carpal tunnel syndrome, worse on the right where there is ABP atrophy and constant numbness over the median nerve distribution.  I offered repeat NCS/EMG to evaluate the severity, but patient prefers to hold on any testing or orthopeadic referral until after I have reviewed her previous NCS/EMG from 2014.  I will request these records from [facility].  Patient is not keen on seeking surgical options, therefore I stressed the importance of using bilateral wrist splint daily.     2.  Left upper arm musculoskeletal pain s/p fall.  Recommend conservative therapies with NSAIDs and ice.  Muscle relaxer declined.  Further recommendations will be made based after reviewing her prior EMG   The duration of  this appointment visit was 40 minutes of face-to-face time with the patient.  Greater than 50% of this time was spent in counseling, explanation of diagnosis, planning of further management, and coordination of care.   Thank you for allowing me to participate in patient's care.  If I can answer any additional questions, I would be pleased to do so.    Sincerely,    Donika K. Posey Pronto, DO

## 2016-04-25 NOTE — Patient Instructions (Signed)
1.  Start using a wrist splint in both hands 2.  We will request records from [facility] to review  Please use the wrist splint daily for the next 3 months.  If your symptoms get worse, please call and schedule a return visit.

## 2016-04-28 ENCOUNTER — Telehealth: Payer: Self-pay | Admitting: Neurology

## 2016-04-28 NOTE — Telephone Encounter (Signed)
NCS/EMG results from [facility] rec'd dated 01/09/2013 which shows absent bilateral median sensory responses, absent R motor median response, and severely prolonged and reduced L median motor response.  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   I called and discussed these findings with patient and her husband.  They do not want to seek surgical options.  It was explained that the only other option is symptom control with medications and using a wrist splint, but with the severity of her nerve injury, recovery with conservative therapies is low.  She will be more compliant about using the wrist splint and call to schedule a return visit in a few months for reassessment.  Kristen K. Allena Katz, DO

## 2016-10-12 ENCOUNTER — Encounter: Payer: Self-pay | Admitting: Family Medicine

## 2017-11-04 ENCOUNTER — Emergency Department (HOSPITAL_COMMUNITY): Payer: Federal, State, Local not specified - PPO

## 2017-11-04 ENCOUNTER — Encounter (HOSPITAL_COMMUNITY): Payer: Self-pay | Admitting: Emergency Medicine

## 2017-11-04 ENCOUNTER — Emergency Department (HOSPITAL_COMMUNITY)
Admission: EM | Admit: 2017-11-04 | Discharge: 2017-11-05 | Disposition: A | Discharge status: Home / Self Care | Payer: Federal, State, Local not specified - PPO | Attending: Emergency Medicine | Admitting: Emergency Medicine

## 2017-11-04 ENCOUNTER — Other Ambulatory Visit: Payer: Self-pay

## 2017-11-04 DIAGNOSIS — R51 Headache: Secondary | ICD-10-CM | POA: Insufficient documentation

## 2017-11-04 DIAGNOSIS — Z794 Long term (current) use of insulin: Secondary | ICD-10-CM | POA: Diagnosis not present

## 2017-11-04 DIAGNOSIS — R112 Nausea with vomiting, unspecified: Secondary | ICD-10-CM | POA: Diagnosis not present

## 2017-11-04 DIAGNOSIS — E1165 Type 2 diabetes mellitus with hyperglycemia: Secondary | ICD-10-CM | POA: Diagnosis not present

## 2017-11-04 DIAGNOSIS — R1013 Epigastric pain: Secondary | ICD-10-CM | POA: Insufficient documentation

## 2017-11-04 LAB — COMPREHENSIVE METABOLIC PANEL
ALK PHOS: 64 U/L (ref 38–126)
ALT: 14 U/L (ref 14–54)
AST: 16 U/L (ref 15–41)
Albumin: 3.8 g/dL (ref 3.5–5.0)
Anion gap: 9 (ref 5–15)
BILIRUBIN TOTAL: 0.8 mg/dL (ref 0.3–1.2)
BUN: 10 mg/dL (ref 6–20)
CALCIUM: 9.3 mg/dL (ref 8.9–10.3)
CO2: 26 mmol/L (ref 22–32)
CREATININE: 0.77 mg/dL (ref 0.44–1.00)
Chloride: 102 mmol/L (ref 101–111)
GFR calc non Af Amer: >60 mL/min (ref >60)
Glucose, Bld: 305 mg/dL — ABNORMAL HIGH (ref 65–99)
Potassium: 3.8 mmol/L (ref 3.5–5.1)
Sodium: 137 mmol/L (ref 135–145)
TOTAL PROTEIN: 7.3 g/dL (ref 6.5–8.1)

## 2017-11-04 LAB — URINALYSIS, ROUTINE W REFLEX MICROSCOPIC
Bacteria, UA: NONE SEEN
Bilirubin Urine: NEGATIVE
Ketones, ur: NEGATIVE mg/dL
NITRITE: NEGATIVE
Protein, ur: NEGATIVE mg/dL
SPECIFIC GRAVITY, URINE: 1.005 (ref 1.005–1.030)
pH: 6 (ref 5.0–8.0)

## 2017-11-04 LAB — CBC
HCT: 40.5 % (ref 36.0–46.0)
Hemoglobin: 13.5 g/dL (ref 12.0–15.0)
MCH: 30.1 pg (ref 26.0–34.0)
MCHC: 33.3 g/dL (ref 30.0–36.0)
MCV: 90.4 fL (ref 78.0–100.0)
PLATELETS: 227 10*3/uL (ref 150–400)
RBC: 4.48 MIL/uL (ref 3.87–5.11)
RDW: 11.9 % (ref 11.5–15.5)
WBC: 5.2 10*3/uL (ref 4.0–10.5)

## 2017-11-04 LAB — I-STAT BETA HCG BLOOD, ED (MC, WL, AP ONLY): I-stat hCG, quantitative: <5 m[IU]/mL (ref <5)

## 2017-11-04 LAB — LIPASE, BLOOD: Lipase: 33 U/L (ref 11–51)

## 2017-11-04 MED ORDER — IOHEXOL 300 MG/ML  SOLN
100.0000 mL | Freq: Once | INTRAMUSCULAR | Status: AC | PRN
  Administered 2017-11-04: 100 mL via INTRAVENOUS

## 2017-11-04 MED ORDER — GI COCKTAIL ~~LOC~~
30.0000 mL | Freq: Once | ORAL | Status: AC
  Administered 2017-11-04: 30 mL via ORAL
  Filled 2017-11-04: qty 30

## 2017-11-04 MED ORDER — PANTOPRAZOLE SODIUM 20 MG PO TBEC: 20.0000 mg | DELAYED_RELEASE_TABLET | Freq: Every day | ORAL | 0 refills | Status: DC

## 2017-11-04 MED ORDER — SODIUM CHLORIDE 0.9 % IV BOLUS
1000.0000 mL | Freq: Once | INTRAVENOUS | Status: AC
  Administered 2017-11-04: 1000 mL via INTRAVENOUS

## 2017-11-04 NOTE — Discharge Instructions (Addendum)
Please follow up with your doctor.  Your blood sugar is high and you need to be taking medications for your diabetes or you will have damage to many organs including your kidneys, heart, eyes, and brain.  You can not feel this damage happening but when your sugar is high you are having damage.  You have been prescribed Metformin to take for management of your blood sugar level.  Begin this on 11/07/17.    Your CT scan was reassuring, but did show findings consistent with reflux and stomach irritation. Your bile duct to your gallbladder was also larger than expected and should be followed in the office by your primary doctor and/or a gastroenterologist. Return to the ED for new or concerning symptoms.

## 2017-11-04 NOTE — ED Provider Notes (Signed)
[facility] EMERGENCY DEPARTMENT Provider Note   CSN: [id number] Arrival date & time: 11/04/17  1545     History   Chief Complaint Chief Complaint  Patient presents with  . Abdominal Pain    HPI Kristen West is a 52 y.o. female with a past medical history of uncontrolled diabetes, GERD, who presents today for evaluation of epigastric abdominal pain for the past few days.  She reports that today the pain woke her up and was associated with nausea and one episode of vomiting.  She reports headache, feeling like her ears are scratchy and her throat is sore.  She denies fevers at home.  Mild decrease in appetite.  History is provided by patient and her husband, declined translation services.  Patient reports that she is not taking any medications including her diabetes medicines.    HPI  History reviewed. No pertinent past medical history.  Patient Active Problem List   Diagnosis Date Noted  . Left upper arm pain 03/07/2016  . Diabetes mellitus type 2, uncontrolled (HCC) 02/22/2016  . Numbness and tingling in right hand 02/22/2016  . GERD (gastroesophageal reflux disease) 02/22/2016  . Tinea pedis of both feet 02/22/2016    Past Surgical History:  Procedure Laterality Date  . CESAREAN SECTION    . HERNIA REPAIR       OB History   None      Home Medications    Prior to Admission medications   Medication Sig Start Date End Date Taking? Authorizing Provider  pantoprazole (PROTONIX) 20 MG tablet Take 1 tablet (20 mg total) by mouth daily. 11/04/17 12/04/17  Cristina Gong, PA-C  omeprazole (PRILOSEC) 20 MG capsule Take 1 capsule (20 mg total) by mouth daily. 09/14/12 05/15/13  Maretta Bees, MD    Family History Family History  Problem Relation Age of Onset  . Diabetes Father   . Diabetes Sister     Social History Social History   Tobacco Use  . Smoking status: Never Smoker  . Smokeless tobacco: Never Used  Substance Use Topics  .  Alcohol use: No  . Drug use: No     Allergies   Patient has no known allergies.   Review of Systems Review of Systems  Constitutional: Negative for chills and fever.  HENT: Positive for ear pain and sore throat. Negative for congestion.   Respiratory: Negative for chest tightness and shortness of breath.   Cardiovascular: Negative for chest pain and palpitations.  Gastrointestinal: Positive for abdominal pain, nausea and vomiting. Negative for constipation and diarrhea.  Genitourinary: Negative for difficulty urinating, dysuria, flank pain, frequency and urgency.  Musculoskeletal: Negative for back pain and neck pain.  Skin: Negative for color change, rash and wound.  Neurological: Positive for headaches. Negative for seizures, weakness and numbness.  Psychiatric/Behavioral: Negative for confusion.  All other systems reviewed and are negative.    Physical Exam Updated Vital Signs BP 119/65   Pulse 79   Temp 98.4 F (36.9 C) (Oral)   Resp 17   SpO2 98%   Physical Exam  Constitutional: She is oriented to person, place, and time. She appears well-developed and well-nourished.  Non-toxic appearance. No distress.  HENT:  Head: Normocephalic and atraumatic.  Mouth/Throat: Oropharynx is clear and moist.  Eyes: Conjunctivae are normal. No scleral icterus.  Neck: Neck supple.  Cardiovascular: Normal rate, regular rhythm and normal heart sounds.  No murmur heard. Pulmonary/Chest: Effort normal and breath sounds normal. No respiratory distress.  Abdominal: Soft. Normal appearance and bowel sounds are normal. There is tenderness in the right upper quadrant, epigastric area and left upper quadrant.  Musculoskeletal: She exhibits no edema.  Neurological: She is alert and oriented to person, place, and time.  Skin: Skin is warm and dry.  Psychiatric: She has a normal mood and affect. Her behavior is normal.  Nursing note and vitals reviewed.    ED Treatments / Results   Labs (all labs ordered are listed, but only abnormal results are displayed) Labs Reviewed  COMPREHENSIVE METABOLIC PANEL - Abnormal; Notable for the following components:      Result Value   Glucose, Bld 305 (*)    All other components within normal limits  URINALYSIS, ROUTINE W REFLEX MICROSCOPIC - Abnormal; Notable for the following components:   Color, Urine STRAW (*)    Glucose, UA >=500 (*)    Hgb urine dipstick SMALL (*)    Leukocytes, UA TRACE (*)    All other components within normal limits  URINE CULTURE  LIPASE, BLOOD  CBC  I-STAT BETA HCG BLOOD, ED ([facility], [facility], [facility] ONLY)    EKG EKG Interpretation  Date/Time:  Saturday November 04 2017 15:59:38 EDT Ventricular Rate:  80 PR Interval:  130 QRS Duration: 88 QT Interval:  352 QTC Calculation: 405 R Axis:   52 Text Interpretation:  Normal sinus rhythm Possible Anterior infarct , age undetermined Abnormal ECG No significant change since last tracing Confirmed by Richardean Canal [phone]) on 11/04/2017 6:44:50 PM   Radiology No results found.  Procedures Procedures (including critical care time)  Medications Ordered in ED Medications  iohexol (OMNIPAQUE) 300 MG/ML solution 100 mL (has no administration in time range)  sodium chloride 0.9 % bolus 1,000 mL (1,000 mLs Intravenous New Bag/Given 11/04/17 1958)  gi cocktail (Maalox,Lidocaine,Donnatal) (30 mLs Oral Given 11/04/17 1954)     Initial Impression / Assessment and Plan / ED Course  I have reviewed the triage vital signs and the nursing notes.  Pertinent labs & imaging results that were available during my care of the patient were reviewed by me and considered in my medical decision making (see chart for details).    Kristen West presents today for evaluation of epigastric abdominal pain.  She has diabetes and is not taking any medication.  She exhibits poor understanding of her condition, stating she thinks her sugar is high because she had honey in her tea this  morning.  She had generalized TTP of bilateral upper quadrants and in the epigastric area.  Labs were obtained with out acute abnormalities.  Hyperglycemia of 305.  Urine with over 500 glucose.  Small blood and trace leukocytes, no bacteria seen, no urinary symptoms, will send for culture.  Patient treated with GI cocktail, IV fluids, plan for CT scan abdomen.   At shift change care was transferred to [facility] who will follow pending studies, re-evaulate and determine disposition.       Final Clinical Impressions(s) / ED Diagnoses   Final diagnoses:  Epigastric pain  Type 2 diabetes mellitus with hyperglycemia, without long-term current use of insulin [facility])    ED Discharge Orders        Ordered    pantoprazole (PROTONIX) 20 MG tablet  Daily     11/04/17 2151       Cristina Gong, Cordelia Poche 11/04/17 2221    Charlynne Pander, MD 11/07/17 [phone]

## 2017-11-04 NOTE — ED Triage Notes (Signed)
Pt to ER for evaluation of epigastric abdominal pain onset 3 days ago with n/v and headache. Denies diarrhea.

## 2017-11-05 MED ORDER — ACETAMINOPHEN 500 MG PO TABS
1000.0000 mg | ORAL_TABLET | Freq: Once | ORAL | Status: AC
  Administered 2017-11-05: 1000 mg via ORAL
  Filled 2017-11-05: qty 2

## 2017-11-05 MED ORDER — KETOROLAC TROMETHAMINE 30 MG/ML IJ SOLN
15.0000 mg | Freq: Once | INTRAMUSCULAR | Status: AC
  Administered 2017-11-05: 15 mg via INTRAVENOUS
  Filled 2017-11-05: qty 1

## 2017-11-05 MED ORDER — METFORMIN HCL 500 MG PO TABS: 500.0000 mg | ORAL_TABLET | Freq: Two times a day (BID) | ORAL | 1 refills | Status: AC

## 2017-11-05 NOTE — ED Provider Notes (Signed)
12:48 AM Patient care assumed from Kristen Safe, PA-C at change of shift.  Patient presenting for epigastric abdominal pain over the past few days.  Symptoms associated with nausea and one episode of vomiting today.  She also has complaints of a headache.  CT imaging pending at shift change.  Results of been reviewed.  CT shows questionable wall thickening of the distal gastric antrum/pylorus.  The patient has had improvement to her abdominal pain with a GI cocktail.  Question whether symptoms may be secondary to ongoing peptic ulcer disease.  She was also found to have a dilated CBD of 10 mm in diameter.  This is of unclear etiology, but is felt less likely to represent choledocholithiasis.  The patient has no leukocytosis today.  LFTs preserved.  Lipase is normal.  On my exam of the patient, she has very minimal tenderness in her upper abdomen.  Negative Murphy's sign.  CBD dilatation may also be an age-related finding.  No old imaging for comparison.  I believe it is reasonable for the patient to follow-up outpatient with gastroenterology with respect to her symptoms.  Plan to discharge with Protonix.  Patient also given a prescription of metformin to start on 11/07/2017 for management of hyperglycemia.  Return precautions discussed and provided. Patient discharged in stable condition with no unaddressed concerns.   Results for orders placed or performed during the hospital encounter of 11/04/17  Lipase, blood  Result Value Ref Range   Lipase 33 11 - 51 U/L  Comprehensive metabolic panel  Result Value Ref Range   Sodium 137 135 - 145 mmol/L   Potassium 3.8 3.5 - 5.1 mmol/L   Chloride 102 101 - 111 mmol/L   CO2 26 22 - 32 mmol/L   Glucose, Bld 305 (H) 65 - 99 mg/dL   BUN 10 6 - 20 mg/dL   Creatinine, Ser 6.22 0.44 - 1.00 mg/dL   Calcium 9.3 8.9 - 63.3 mg/dL   Total Protein 7.3 6.5 - 8.1 g/dL   Albumin 3.8 3.5 - 5.0 g/dL   AST 16 15 - 41 U/L   ALT 14 14 - 54 U/L   Alkaline  Phosphatase 64 38 - 126 U/L   Total Bilirubin 0.8 0.3 - 1.2 mg/dL   GFR calc non Af Amer >60 >60 mL/min   GFR calc Af Amer >60 >60 mL/min   Anion gap 9 5 - 15  CBC  Result Value Ref Range   WBC 5.2 4.0 - 10.5 K/uL   RBC 4.48 3.87 - 5.11 MIL/uL   Hemoglobin 13.5 12.0 - 15.0 g/dL   HCT 35.4 56.2 - 56.3 %   MCV 90.4 78.0 - 100.0 fL   MCH 30.1 26.0 - 34.0 pg   MCHC 33.3 30.0 - 36.0 g/dL   RDW 89.3 73.4 - 28.7 %   Platelets 227 150 - 400 K/uL  Urinalysis, Routine w reflex microscopic  Result Value Ref Range   Color, Urine STRAW (A) YELLOW   APPearance CLEAR CLEAR   Specific Gravity, Urine 1.005 1.005 - 1.030   pH 6.0 5.0 - 8.0   Glucose, UA >=500 (A) NEGATIVE mg/dL   Hgb urine dipstick SMALL (A) NEGATIVE   Bilirubin Urine NEGATIVE NEGATIVE   Ketones, ur NEGATIVE NEGATIVE mg/dL   Protein, ur NEGATIVE NEGATIVE mg/dL   Nitrite NEGATIVE NEGATIVE   Leukocytes, UA TRACE (A) NEGATIVE   RBC / HPF 0-5 0 - 5 RBC/hpf   WBC, UA 0-5 0 - 5 WBC/hpf   Bacteria,  UA NONE SEEN NONE SEEN   Squamous Epithelial / LPF 0-5 0 - 5  I-Stat beta hCG blood, ED  Result Value Ref Range   I-stat hCG, quantitative <5.0 <5 mIU/mL   Comment 3           Ct Abdomen Pelvis W Contrast  Result Date: 11/04/2017 CLINICAL DATA:  Nausea, vomiting, and generalized acute abdominal pain for 3 days, headache, history type II diabetes mellitus EXAM: CT ABDOMEN AND PELVIS WITH CONTRAST TECHNIQUE: Multidetector CT imaging of the abdomen and pelvis was performed using the standard protocol following bolus administration of intravenous contrast. Sagittal and coronal MPR images reconstructed from axial data set. CONTRAST:  OMNIPAQUE IOHEXOL 300 MG/ML  SOLN COMPARISON:  None FINDINGS: Lower chest: 10 mm diameter nodular density at minor fissure image 3. Lung bases otherwise clear. Hepatobiliary: Mild focal fatty infiltration of liver adjacent to falciform fissure. Gallbladder and liver otherwise normal appearance. CBD dilated 10  mm diameter without identified stone. Pancreas: Normal appearance Spleen: Normal appearance Adrenals/Urinary Tract: Adrenal glands normal appearance. Kidneys, ureters, and bladder normal appearance Stomach/Bowel: Normal appendix. Questionable wall thickening of the distal gastric antrum and pylorus versus artifact from under-distention. Increased stool in colon. Bowel loops otherwise unremarkable. Vascular/Lymphatic: Vascular structures grossly patent. No adenopathy. Reproductive: Unremarkable uterus and adnexa Other: Umbilical and 2 supraumbilical ventral hernias containing fat. No bowel herniation. No free air free fluid. No definite inflammatory process. Musculoskeletal: No acute osseous findings. Grade 1 anterolisthesis and pseudo disc at L5-S1 secondary to facet degenerative changes. IMPRESSION: Dilated CBD 10 mm diameter of uncertain etiology; recommend correlation with LFTs. Questionable mild wall thickening of the distal gastric antrum/pylorus though this could be an artifact related underdistention, recommend correlation with patient's symptoms and consider EGD. Three ventral hernias containing fat. Electronically Signed   By: Ulyses Southward M.D.   On: 11/04/2017 23:48      Antony Madura, PA-C 11/05/17 0410    Gerhard Munch, MD 11/05/17 [phone]

## 2017-11-06 LAB — URINE CULTURE: Culture: 10000 — AB

## 2018-01-13 DIAGNOSIS — Q159 Congenital malformation of eye, unspecified: Secondary | ICD-10-CM

## 2018-08-01 DIAGNOSIS — R7611 Nonspecific reaction to tuberculin skin test without active tuberculosis: Secondary | ICD-10-CM | POA: Diagnosis not present

## 2018-10-12 DIAGNOSIS — Z20828 Contact with and (suspected) exposure to other viral communicable diseases: Secondary | ICD-10-CM | POA: Diagnosis not present

## 2018-12-03 DIAGNOSIS — Z20828 Contact with and (suspected) exposure to other viral communicable diseases: Secondary | ICD-10-CM | POA: Diagnosis not present

## 2019-07-16 IMAGING — CT CT ABD-PELV W/ CM
2 of 5 series · 16 of 46 positions shown, 18 images · IV contrast (APPLIED)
Comparison: None

CLINICAL DATA: Nausea, vomiting, and generalized acute abdominal
pain for 3 days, headache, history type II diabetes mellitus

EXAM:
CT ABDOMEN AND PELVIS WITH CONTRAST
TECHNIQUE: Multidetector CT imaging of the abdomen and pelvis was performed
using the standard protocol following bolus administration of
intravenous contrast. Sagittal and coronal MPR images reconstructed
from axial data set.
CONTRAST:  100mL OMNIPAQUE IOHEXOL 300 MG/ML  SOLN

[Series 3: abd/ pelvis 5.0 i30f 2 · axial · 0.78mm/px · z∈[+786,+1171]mm · 13 of 87 slices shown, 15 images]
[im 5/87  soft-tissue]
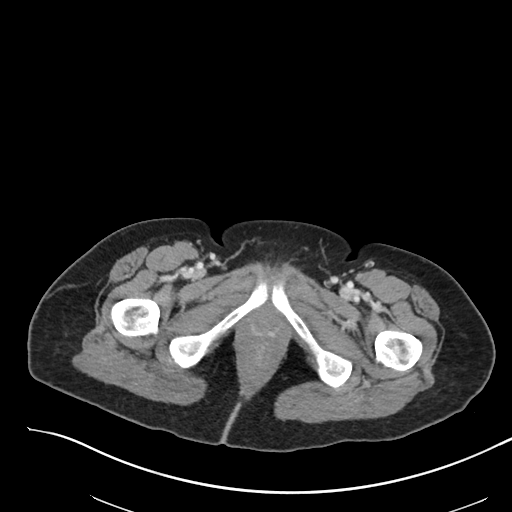
[im 5/87  bone]
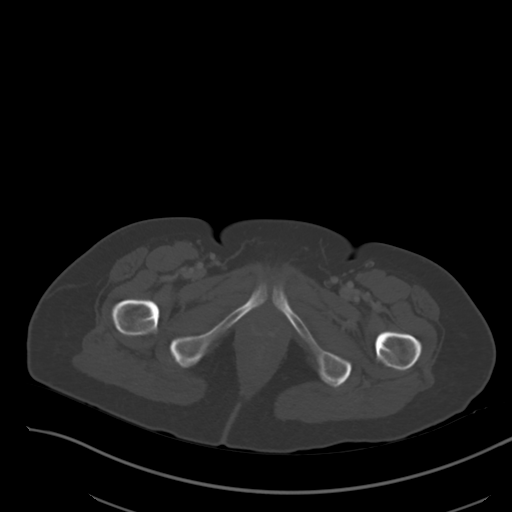
[im 14/87  soft-tissue]
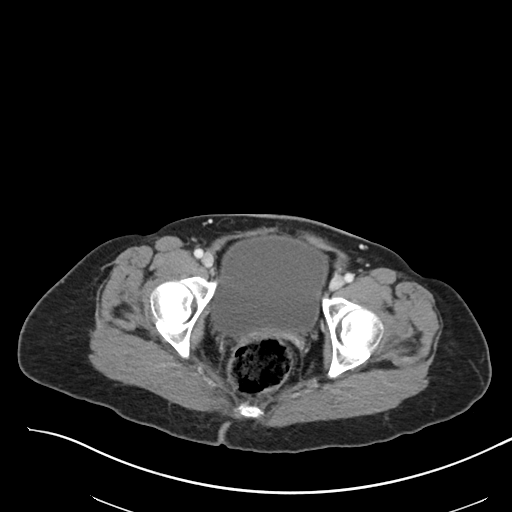
[im 19/87  soft-tissue]
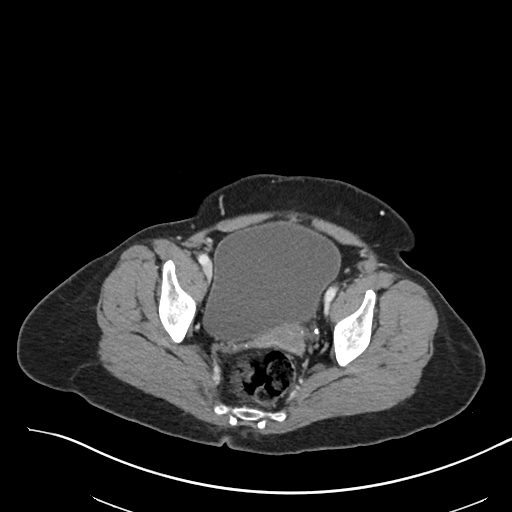
[im 23/87  soft-tissue]
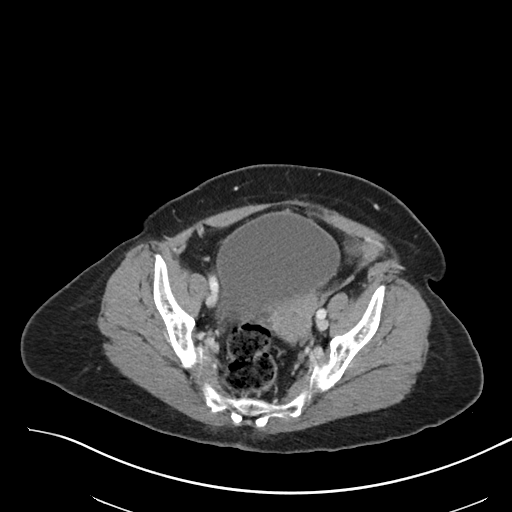
[im 32/87  soft-tissue]
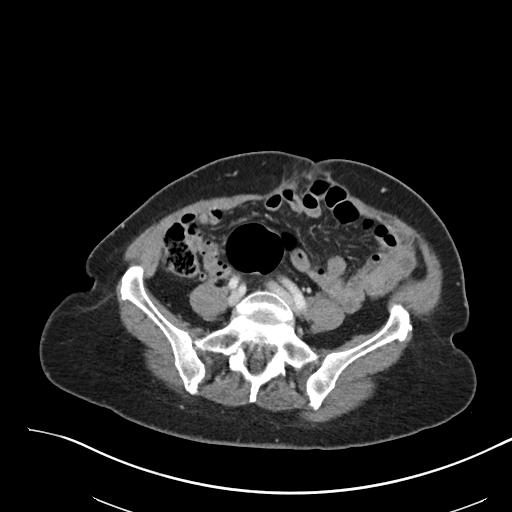
[im 37/87  soft-tissue]
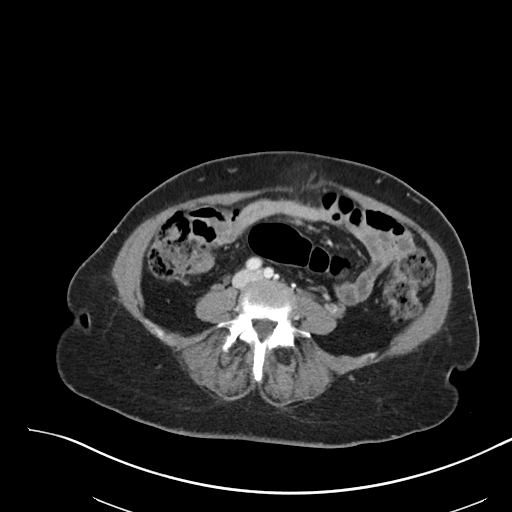
[im 46/87  soft-tissue]
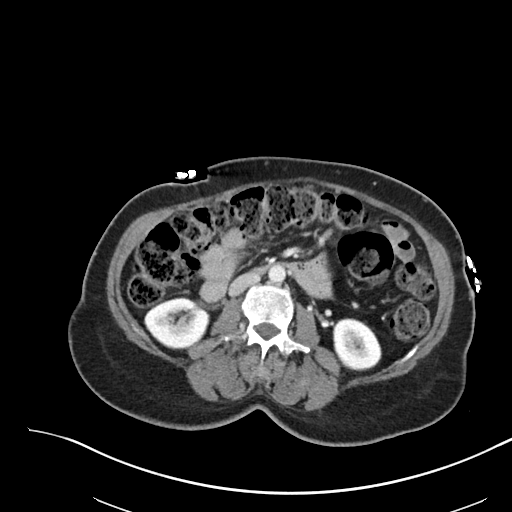
[im 50/87  soft-tissue]
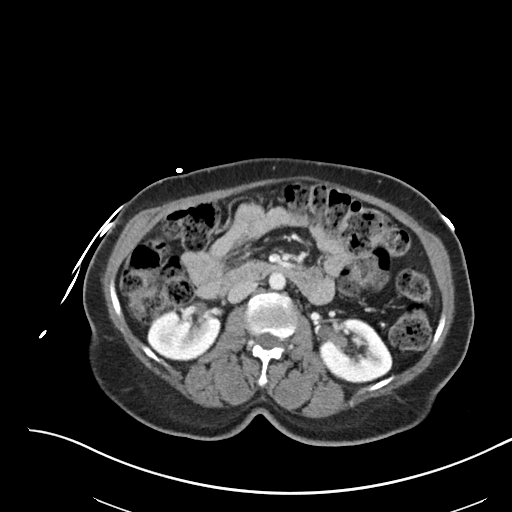
[im 55/87  soft-tissue]
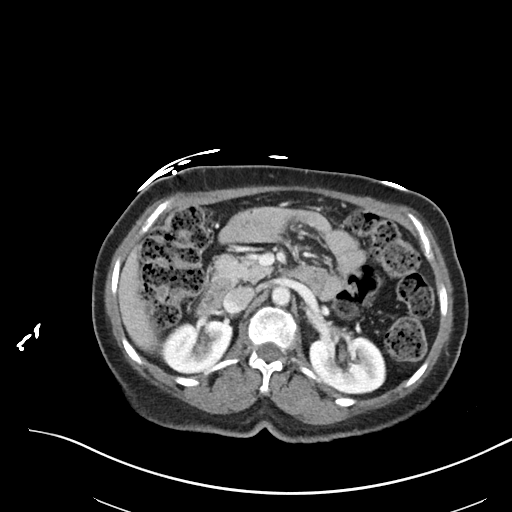
[im 55/87  bone]
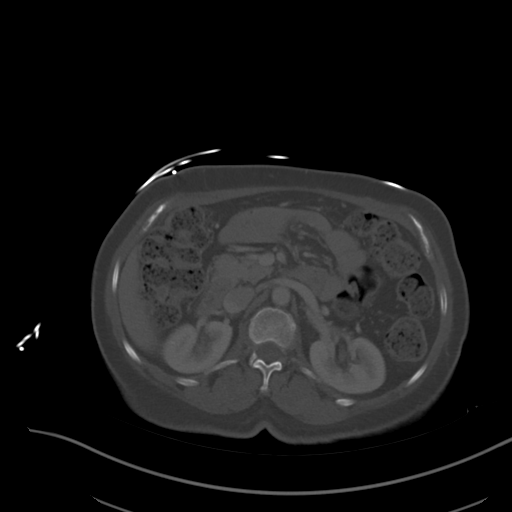
[im 64/87  soft-tissue]
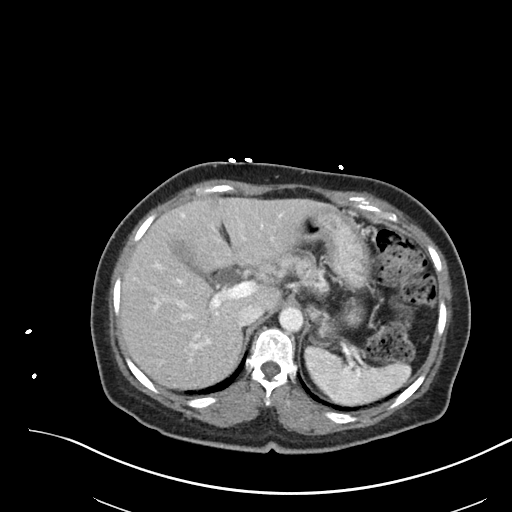
[im 68/87  soft-tissue]
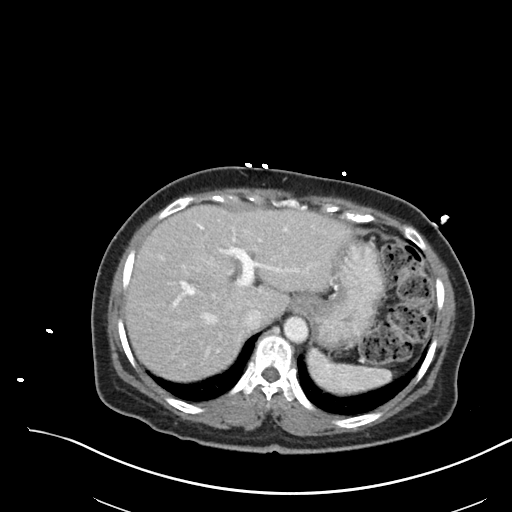
[im 73/87  soft-tissue]
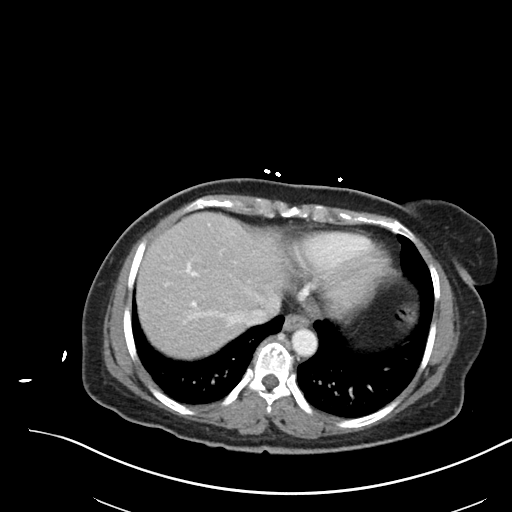
[im 82/87  soft-tissue]
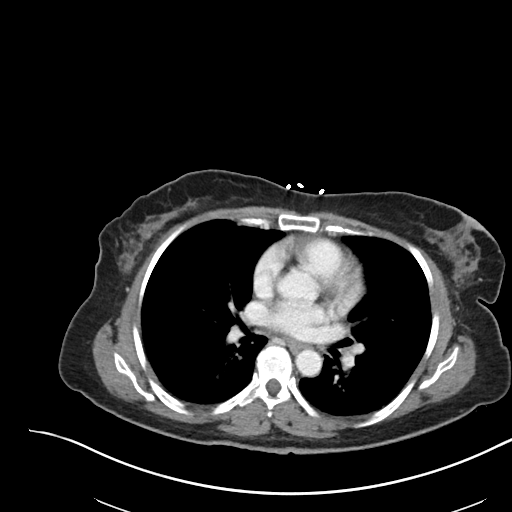

[Series 6: coronal soft tissue · coronal · 0.80mm/px · 3 of 101 slices shown]
[im 34/101  soft-tissue]
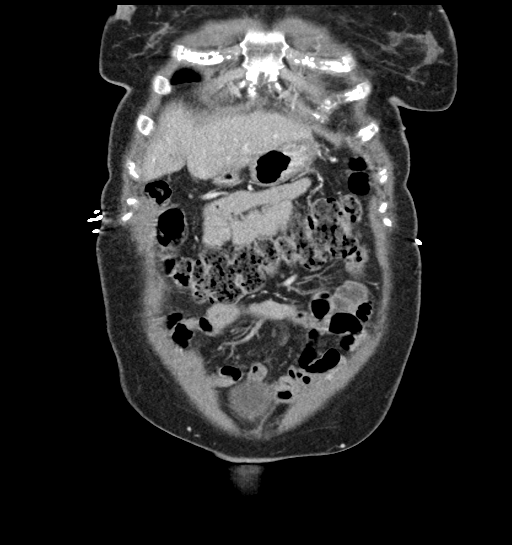
[im 45/101  soft-tissue]
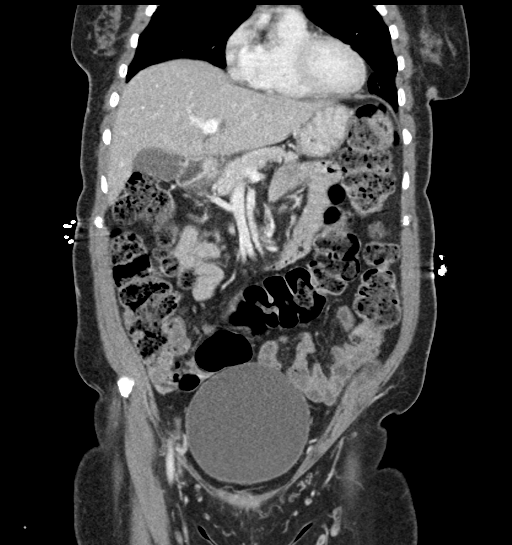
[im 56/101  soft-tissue]
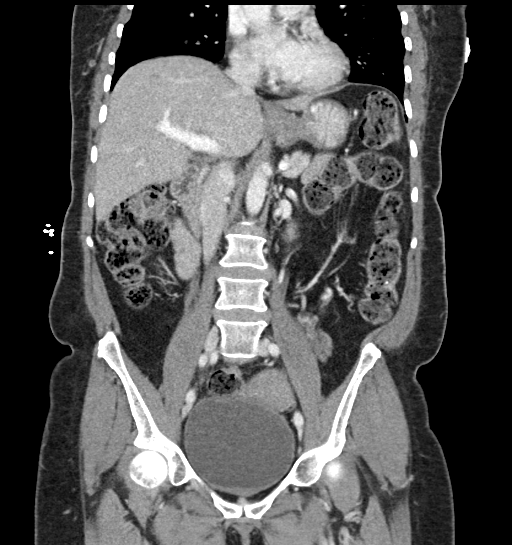

[16 of 46 positions shown; findings below may reference images not displayed]

FINDINGS: Lower chest: 10 mm diameter nodular density at minor fissure image
3. Lung bases otherwise clear.

Hepatobiliary: Mild focal fatty infiltration of liver adjacent to
falciform fissure. Gallbladder and liver otherwise normal
appearance. CBD dilated 10 mm diameter without identified stone.

Pancreas: Normal appearance

Spleen: Normal appearance

Adrenals/Urinary Tract: Adrenal glands normal appearance. Kidneys,
ureters, and bladder normal appearance

Stomach/Bowel: Normal appendix. Questionable wall thickening of the
distal gastric antrum and pylorus versus artifact from
under-distention. Increased stool in colon. Bowel loops otherwise
unremarkable.

Vascular/Lymphatic: Vascular structures grossly patent. No
adenopathy.

Reproductive: Unremarkable uterus and adnexa

Other: Umbilical and 2 supraumbilical ventral hernias containing
fat. No bowel herniation. No free air free fluid. No definite
inflammatory process.

Musculoskeletal: No acute osseous findings. Grade 1 anterolisthesis
and pseudo disc at L5-S1 secondary to facet degenerative changes.
IMPRESSION: Dilated CBD 10 mm diameter of uncertain etiology; recommend
correlation with LFTs.

Questionable mild wall thickening of the distal gastric
antrum/pylorus though this could be an artifact related
underdistention, recommend correlation with patient's symptoms and
consider EGD.

Three ventral hernias containing fat.

## 2019-12-24 ENCOUNTER — Other Ambulatory Visit: Payer: Self-pay | Admitting: Internal Medicine

## 2019-12-24 DIAGNOSIS — Z23 Encounter for immunization: Secondary | ICD-10-CM | POA: Diagnosis not present

## 2019-12-24 DIAGNOSIS — Z13220 Encounter for screening for lipoid disorders: Secondary | ICD-10-CM | POA: Diagnosis not present

## 2019-12-24 DIAGNOSIS — Z8632 Personal history of gestational diabetes: Secondary | ICD-10-CM | POA: Diagnosis not present

## 2019-12-24 DIAGNOSIS — Z1231 Encounter for screening mammogram for malignant neoplasm of breast: Secondary | ICD-10-CM

## 2019-12-24 DIAGNOSIS — Z0000 Encounter for general adult medical examination without abnormal findings: Secondary | ICD-10-CM | POA: Diagnosis not present

## 2019-12-26 ENCOUNTER — Other Ambulatory Visit: Payer: Self-pay | Admitting: Internal Medicine

## 2019-12-26 ENCOUNTER — Other Ambulatory Visit (HOSPITAL_COMMUNITY)
Admission: RE | Admit: 2019-12-26 | Discharge: 2019-12-26 | Disposition: A | Discharge status: Home / Self Care | Payer: Federal, State, Local not specified - PPO | Source: Ambulatory Visit | Attending: Pathology | Admitting: Pathology

## 2019-12-26 DIAGNOSIS — Z124 Encounter for screening for malignant neoplasm of cervix: Secondary | ICD-10-CM | POA: Diagnosis not present

## 2019-12-26 DIAGNOSIS — Z01419 Encounter for gynecological examination (general) (routine) without abnormal findings: Secondary | ICD-10-CM | POA: Diagnosis not present

## 2019-12-31 LAB — CYTOLOGY - PAP
Comment: NEGATIVE
Diagnosis: NEGATIVE
High risk HPV: NEGATIVE

## 2020-01-03 ENCOUNTER — Ambulatory Visit: Payer: Federal, State, Local not specified - PPO

## 2020-01-06 ENCOUNTER — Ambulatory Visit: Payer: Federal, State, Local not specified - PPO

## 2020-02-05 DIAGNOSIS — R21 Rash and other nonspecific skin eruption: Secondary | ICD-10-CM | POA: Diagnosis not present

## 2020-02-05 DIAGNOSIS — E1165 Type 2 diabetes mellitus with hyperglycemia: Secondary | ICD-10-CM | POA: Diagnosis not present

## 2020-07-07 ENCOUNTER — Other Ambulatory Visit: Payer: Self-pay | Admitting: Internal Medicine

## 2020-07-07 DIAGNOSIS — Z1231 Encounter for screening mammogram for malignant neoplasm of breast: Secondary | ICD-10-CM

## 2020-12-15 ENCOUNTER — Other Ambulatory Visit: Payer: Self-pay

## 2020-12-15 ENCOUNTER — Ambulatory Visit
Admission: RE | Admit: 2020-12-15 | Discharge: 2020-12-15 | Disposition: A | Discharge status: Home / Self Care | Payer: PRIVATE HEALTH INSURANCE | Source: Ambulatory Visit | Attending: Internal Medicine | Admitting: Internal Medicine

## 2020-12-15 DIAGNOSIS — Z1231 Encounter for screening mammogram for malignant neoplasm of breast: Secondary | ICD-10-CM

## 2021-08-30 ENCOUNTER — Ambulatory Visit: Payer: PRIVATE HEALTH INSURANCE

## 2021-08-30 ENCOUNTER — Other Ambulatory Visit: Payer: Self-pay | Admitting: Internal Medicine

## 2021-08-30 DIAGNOSIS — M79642 Pain in left hand: Secondary | ICD-10-CM

## 2022-11-18 DIAGNOSIS — U071 COVID-19: Secondary | ICD-10-CM | POA: Diagnosis not present

## 2022-11-18 DIAGNOSIS — J069 Acute upper respiratory infection, unspecified: Secondary | ICD-10-CM | POA: Diagnosis not present

## 2022-11-18 DIAGNOSIS — Z03818 Encounter for observation for suspected exposure to other biological agents ruled out: Secondary | ICD-10-CM | POA: Diagnosis not present

## 2023-04-04 DIAGNOSIS — M545 Low back pain, unspecified: Secondary | ICD-10-CM | POA: Diagnosis not present

## 2023-04-04 DIAGNOSIS — Z556 Problems related to health literacy: Secondary | ICD-10-CM | POA: Diagnosis not present

## 2023-04-04 DIAGNOSIS — M5441 Lumbago with sciatica, right side: Secondary | ICD-10-CM | POA: Diagnosis not present

## 2023-04-04 DIAGNOSIS — M4317 Spondylolisthesis, lumbosacral region: Secondary | ICD-10-CM | POA: Diagnosis not present

## 2023-04-09 ENCOUNTER — Emergency Department (HOSPITAL_BASED_OUTPATIENT_CLINIC_OR_DEPARTMENT_OTHER)
Admission: EM | Admit: 2023-04-09 | Discharge: 2023-04-09 | Disposition: A | Discharge status: Home / Self Care | Payer: 59 | Attending: Emergency Medicine | Admitting: Emergency Medicine

## 2023-04-09 ENCOUNTER — Encounter (HOSPITAL_BASED_OUTPATIENT_CLINIC_OR_DEPARTMENT_OTHER): Payer: Self-pay | Admitting: Emergency Medicine

## 2023-04-09 ENCOUNTER — Other Ambulatory Visit: Payer: Self-pay

## 2023-04-09 DIAGNOSIS — M5431 Sciatica, right side: Secondary | ICD-10-CM | POA: Insufficient documentation

## 2023-04-09 DIAGNOSIS — M545 Low back pain, unspecified: Secondary | ICD-10-CM | POA: Diagnosis present

## 2023-04-09 DIAGNOSIS — M5441 Lumbago with sciatica, right side: Secondary | ICD-10-CM | POA: Diagnosis not present

## 2023-04-09 MED ORDER — KETOROLAC TROMETHAMINE 30 MG/ML IJ SOLN
30.0000 mg | Freq: Once | INTRAMUSCULAR | Status: AC
  Administered 2023-04-09: 30 mg via INTRAMUSCULAR
  Filled 2023-04-09: qty 1

## 2023-04-09 NOTE — ED Triage Notes (Signed)
Back pain down right leg. X 3 days Ambulatory to triage

## 2023-04-09 NOTE — ED Provider Notes (Signed)
[place] EMERGENCY DEPARTMENT AT [facility]  Provider Note  CSN: [id number] Arrival date & time: 04/09/23 1954  History Chief Complaint  Patient presents with   Back Pain    Kristen West is a 57 y.o. female here with husband for evaluation of R lower back pain, radiating into R leg. Onset about a week ago. Seen at [facility] for same on 11/5 and given Rx for Naprosyn and Zanaflex which was helping some but she did not take it today and her pain has been worse this evening. No fevers, no falls. No numbness, tingling or weakness. No bowel or bladder incontinence.    Home Medications Prior to Admission medications   Medication Sig Start Date End Date Taking? Authorizing Provider  metFORMIN (GLUCOPHAGE) 500 MG tablet Take 1 tablet (500 mg total) by mouth 2 (two) times daily with a meal. Start this medication on 11/07/17 11/05/17   Antony Madura, PA-C  pantoprazole (PROTONIX) 20 MG tablet Take 1 tablet (20 mg total) by mouth daily. 11/04/17 12/04/17  Cristina Gong, PA-C     Allergies    Patient has no known allergies.   Review of Systems   Review of Systems Please see HPI for pertinent positives and negatives  Physical Exam BP (!) 146/75   Pulse 96   Temp 98.4 F (36.9 C) (Oral)   Resp 20   SpO2 97%   Physical Exam Vitals and nursing note reviewed.  Constitutional:      Appearance: Normal appearance.  HENT:     Head: Normocephalic and atraumatic.     Nose: Nose normal.     Mouth/Throat:     Mouth: Mucous membranes are moist.  Eyes:     Extraocular Movements: Extraocular movements intact.     Conjunctiva/sclera: Conjunctivae normal.  Cardiovascular:     Rate and Rhythm: Normal rate.  Pulmonary:     Effort: Pulmonary effort is normal.     Breath sounds: Normal breath sounds.  Abdominal:     General: Abdomen is flat.     Palpations: Abdomen is soft.     Tenderness: There is no abdominal tenderness.  Musculoskeletal:        General: Tenderness (R  lumbar paraspinal muscles) present. No swelling. Normal range of motion.     Cervical back: Neck supple.  Skin:    General: Skin is warm and dry.  Neurological:     General: No focal deficit present.     Mental Status: She is alert.     Sensory: No sensory deficit.     Motor: No weakness.     Gait: Gait normal.     Deep Tendon Reflexes: Reflexes normal.  Psychiatric:        Mood and Affect: Mood normal.     ED Results / Procedures / Treatments   EKG None  Procedures Procedures  Medications Ordered in the ED Medications  ketorolac (TORADOL) 30 MG/ML injection 30 mg (has no administration in time range)    Initial Impression and Plan  Patient here for R lower back pain with some radicular symptoms. No red flags. Already has Rx for NSAID and muscle relaxer. Will give toradol IM here for pain and recommend she take her oral medications as prescribed and follow up with PCP for further management.   ED Course       MDM Rules/Calculators/A&P Medical Decision Making Problems Addressed: Sciatica of right side: acute illness or injury  Risk Prescription drug management.  Final Clinical Impression(s) / ED Diagnoses Final diagnoses:  Sciatica of right side    Rx / DC Orders ED Discharge Orders     None        Pollyann Savoy, MD 04/09/23 2319

## 2023-04-13 ENCOUNTER — Other Ambulatory Visit: Payer: Self-pay | Admitting: Internal Medicine

## 2023-04-13 DIAGNOSIS — Z1231 Encounter for screening mammogram for malignant neoplasm of breast: Secondary | ICD-10-CM

## 2023-04-20 ENCOUNTER — Emergency Department (HOSPITAL_COMMUNITY): Payer: 59

## 2023-04-20 ENCOUNTER — Other Ambulatory Visit: Payer: Self-pay

## 2023-04-20 ENCOUNTER — Emergency Department (HOSPITAL_COMMUNITY)
Admission: EM | Admit: 2023-04-20 | Discharge: 2023-04-20 | Disposition: A | Discharge status: Home / Self Care | Payer: 59 | Attending: Emergency Medicine | Admitting: Emergency Medicine

## 2023-04-20 DIAGNOSIS — M545 Low back pain, unspecified: Secondary | ICD-10-CM | POA: Diagnosis not present

## 2023-04-20 DIAGNOSIS — M5431 Sciatica, right side: Secondary | ICD-10-CM | POA: Diagnosis not present

## 2023-04-20 DIAGNOSIS — M5441 Lumbago with sciatica, right side: Secondary | ICD-10-CM | POA: Diagnosis not present

## 2023-04-20 DIAGNOSIS — M79604 Pain in right leg: Secondary | ICD-10-CM | POA: Diagnosis present

## 2023-04-20 DIAGNOSIS — R634 Abnormal weight loss: Secondary | ICD-10-CM | POA: Diagnosis not present

## 2023-04-20 DIAGNOSIS — K760 Fatty (change of) liver, not elsewhere classified: Secondary | ICD-10-CM | POA: Diagnosis not present

## 2023-04-20 DIAGNOSIS — R109 Unspecified abdominal pain: Secondary | ICD-10-CM | POA: Diagnosis not present

## 2023-04-20 DIAGNOSIS — R531 Weakness: Secondary | ICD-10-CM | POA: Diagnosis not present

## 2023-04-20 LAB — PREGNANCY, URINE: Preg Test, Ur: NEGATIVE

## 2023-04-20 LAB — CBC WITH DIFFERENTIAL/PLATELET
Abs Immature Granulocytes: 0 10*3/uL (ref 0.00–0.07)
Basophils Absolute: 0 10*3/uL (ref 0.0–0.1)
Basophils Relative: 1 %
Eosinophils Absolute: 0.1 10*3/uL (ref 0.0–0.5)
Eosinophils Relative: 2 %
HCT: 39.3 % (ref 36.0–46.0)
Hemoglobin: 12.8 g/dL (ref 12.0–15.0)
Immature Granulocytes: 0 %
Lymphocytes Relative: 43 %
Lymphs Abs: 1.6 10*3/uL (ref 0.7–4.0)
MCH: 29.2 pg (ref 26.0–34.0)
MCHC: 32.6 g/dL (ref 30.0–36.0)
MCV: 89.5 fL (ref 80.0–100.0)
Monocytes Absolute: 0.3 10*3/uL (ref 0.1–1.0)
Monocytes Relative: 7 %
Neutro Abs: 1.8 10*3/uL (ref 1.7–7.7)
Neutrophils Relative %: 47 %
Platelets: 301 10*3/uL (ref 150–400)
RBC: 4.39 MIL/uL (ref 3.87–5.11)
RDW: 11.9 % (ref 11.5–15.5)
WBC: 3.8 10*3/uL — ABNORMAL LOW (ref 4.0–10.5)
nRBC: 0 % (ref 0.0–0.2)

## 2023-04-20 LAB — URINALYSIS, ROUTINE W REFLEX MICROSCOPIC
Bacteria, UA: NONE SEEN
Bilirubin Urine: NEGATIVE
Glucose, UA: NEGATIVE mg/dL
Ketones, ur: NEGATIVE mg/dL
Leukocytes,Ua: NEGATIVE
Nitrite: NEGATIVE
Protein, ur: NEGATIVE mg/dL
Specific Gravity, Urine: 1.009 (ref 1.005–1.030)
pH: 7 (ref 5.0–8.0)

## 2023-04-20 LAB — COMPREHENSIVE METABOLIC PANEL
ALT: 14 U/L (ref 0–44)
AST: 17 U/L (ref 15–41)
Albumin: 3.7 g/dL (ref 3.5–5.0)
Alkaline Phosphatase: 64 U/L (ref 38–126)
Anion gap: 7 (ref 5–15)
BUN: 11 mg/dL (ref 6–20)
CO2: 26 mmol/L (ref 22–32)
Calcium: 9.1 mg/dL (ref 8.9–10.3)
Chloride: 105 mmol/L (ref 98–111)
Creatinine, Ser: 0.78 mg/dL (ref 0.44–1.00)
GFR, Estimated: >60 mL/min (ref >60)
Glucose, Bld: 123 mg/dL — ABNORMAL HIGH (ref 70–99)
Potassium: 3.9 mmol/L (ref 3.5–5.1)
Sodium: 138 mmol/L (ref 135–145)
Total Bilirubin: 0.5 mg/dL (ref <1.2)
Total Protein: 7.4 g/dL (ref 6.5–8.1)

## 2023-04-20 LAB — LIPASE, BLOOD: Lipase: 33 U/L (ref 11–51)

## 2023-04-20 MED ORDER — HYDROCODONE-ACETAMINOPHEN 5-325 MG PO TABS
1.0000 | ORAL_TABLET | Freq: Once | ORAL | Status: AC
  Administered 2023-04-20: 1 via ORAL
  Filled 2023-04-20: qty 1

## 2023-04-20 MED ORDER — IOHEXOL 350 MG/ML SOLN
55.0000 mL | Freq: Once | INTRAVENOUS | Status: AC | PRN
  Administered 2023-04-20: 55 mL via INTRAVENOUS

## 2023-04-20 MED ORDER — SODIUM CHLORIDE 0.9 % IV BOLUS
1000.0000 mL | Freq: Once | INTRAVENOUS | Status: AC
  Administered 2023-04-20: 1000 mL via INTRAVENOUS

## 2023-04-20 MED ORDER — OXYCODONE-ACETAMINOPHEN 5-325 MG PO TABS: 1.0000 | ORAL_TABLET | Freq: Three times a day (TID) | ORAL | 0 refills | Status: DC | PRN

## 2023-04-20 NOTE — Discharge Instructions (Addendum)
You were seen in the emergency department for continued sciatica or back pain radiating down your right leg.  You were also having unexplained weight loss and lack of appetite.  We did lab work urinalysis chest x-ray and CAT scan of your abdomen that did not show an obvious explanation for your symptoms.  We are prescribing you some narcotic pain medicine which should help with your pain.  Please follow-up with your primary care doctor and schedule an appointment with the spine doctors at [facility] neurosurgery.  Return to the emergency department if any worsening or concerning symptoms

## 2023-04-20 NOTE — ED Triage Notes (Signed)
Pt. Stated Im having back pain and rt. Leg pain for 2 weeks.

## 2023-04-20 NOTE — ED Provider Notes (Signed)
[place] EMERGENCY DEPARTMENT AT [facility] Provider Note   CSN: [id number] Arrival date & time: 04/20/23  4098     History  Chief Complaint  Patient presents with   Back Pain   Leg Pain    Kristen West is a 57 y.o. female.  She is speaking [place] and declines an interpreter.  Her family is here to assist with translation and history.  She has been having low back pain radiating to her right leg for over 2 weeks.  She has been seen at [facility] and had x-rays done.  She was given NSAIDs and muscle relaxant.  She has also seen her primary care doctor and was found to have a UTI treated with antibiotics.  She continues to have no appetite and is losing weight.  Feeling generally weak.  Continues to have pain in her back and her leg.  Family is concerned with her decline.  She denies any fevers chest pain shortness of breath abdominal pain urinary symptoms.  The history is provided by the patient.  Back Pain Location:  Lumbar spine Quality:  Aching Radiates to:  R foot Pain is:  Worse during the night Onset quality:  Gradual Duration:  2 weeks Timing:  Constant Progression:  Unchanged Chronicity:  New Relieved by:  Nothing Ineffective treatments:  NSAIDs and muscle relaxants Associated symptoms: weight loss   Associated symptoms: no abdominal pain, no bladder incontinence, no bowel incontinence, no chest pain and no dysuria   Risk factors: no hx of cancer        Home Medications Prior to Admission medications   Medication Sig Start Date End Date Taking? Authorizing Provider  metFORMIN (GLUCOPHAGE) 500 MG tablet Take 1 tablet (500 mg total) by mouth 2 (two) times daily with a meal. Start this medication on 11/07/17 11/05/17   Antony Madura, PA-C  pantoprazole (PROTONIX) 20 MG tablet Take 1 tablet (20 mg total) by mouth daily. 11/04/17 12/04/17  Cristina Gong, PA-C      Allergies    Patient has no known allergies.    Review of Systems   Review  of Systems  Constitutional:  Positive for appetite change, fatigue and weight loss.  Respiratory:  Negative for shortness of breath.   Cardiovascular:  Negative for chest pain.  Gastrointestinal:  Negative for abdominal pain and bowel incontinence.  Genitourinary:  Negative for bladder incontinence and dysuria.  Musculoskeletal:  Positive for back pain.    Physical Exam Updated Vital Signs BP (!) 146/82   Pulse (!) 112   Temp 98.4 F (36.9 C)   Resp 18   SpO2 100%  Physical Exam Vitals and nursing note reviewed.  Constitutional:      General: She is not in acute distress.    Appearance: Normal appearance. She is well-developed.  HENT:     Head: Normocephalic and atraumatic.  Eyes:     Conjunctiva/sclera: Conjunctivae normal.  Cardiovascular:     Rate and Rhythm: Normal rate and regular rhythm.     Heart sounds: No murmur heard. Pulmonary:     Effort: Pulmonary effort is normal. No respiratory distress.     Breath sounds: Normal breath sounds.  Abdominal:     Palpations: Abdomen is soft.     Tenderness: There is no abdominal tenderness. There is no guarding or rebound.  Musculoskeletal:        General: No deformity. Normal range of motion.     Cervical back: Neck supple.  Right lower leg: No edema.     Left lower leg: No edema.  Skin:    General: Skin is warm and dry.     Capillary Refill: Capillary refill takes less than 2 seconds.  Neurological:     General: No focal deficit present.     Mental Status: She is alert.     Sensory: No sensory deficit.     Motor: No weakness.     ED Results / Procedures / Treatments   Labs (all labs ordered are listed, but only abnormal results are displayed) Labs Reviewed  COMPREHENSIVE METABOLIC PANEL - Abnormal; Notable for the following components:      Result Value   Glucose, Bld 123 (*)    All other components within normal limits  CBC WITH DIFFERENTIAL/PLATELET - Abnormal; Notable for the following components:   WBC  3.8 (*)    All other components within normal limits  URINALYSIS, ROUTINE W REFLEX MICROSCOPIC - Abnormal; Notable for the following components:   Color, Urine STRAW (*)    Hgb urine dipstick SMALL (*)    All other components within normal limits  LIPASE, BLOOD  PREGNANCY, URINE    EKG None  Radiology CT ABDOMEN PELVIS W CONTRAST  Result Date: 04/20/2023 CLINICAL DATA:  Abdominal pain, acute, nonlocalized weight loss, early satiety EXAM: CT ABDOMEN AND PELVIS WITH CONTRAST TECHNIQUE: Multidetector CT imaging of the abdomen and pelvis was performed using the standard protocol following bolus administration of intravenous contrast. RADIATION DOSE REDUCTION: This exam was performed according to the departmental dose-optimization program which includes automated exposure control, adjustment of the mA and/or kV according to patient size and/or use of iterative reconstruction technique. CONTRAST:  55mL OMNIPAQUE IOHEXOL 350 MG/ML SOLN COMPARISON:  CT scan abdomen and pelvis from 11/04/2017. FINDINGS: Lower chest: There are dependent changes in the visualized lung bases. No overt consolidation. No pleural effusion. The heart is normal in size. No pericardial effusion. Hepatobiliary: The liver is normal in size. Non-cirrhotic configuration. No suspicious mass. These is mild diffuse hepatic steatosis. Mild central intrahepatic bile duct dilation. There is also extrahepatic bile duct dilation which measures up to 15-16 mm in the proximal portion and 8-9 mm in the distal portion. There is relatively abrupt transition near the ampulla of Vater. However, findings are essentially unchanged since the prior study from 2019 and may suggest underlying stricture. No suspicious mass or choledocholithiasis seen. Correlate clinically and with liver function tests. No calcified gallstones. Normal gallbladder wall thickness. No pericholecystic inflammatory changes. Pancreas: Unremarkable. No pancreatic ductal dilatation or  surrounding inflammatory changes. Spleen: Within normal limits. No focal lesion. Adrenals/Urinary Tract: Adrenal glands are unremarkable. No suspicious renal mass. No hydronephrosis. No renal or ureteric calculi. Unremarkable urinary bladder. Stomach/Bowel: No disproportionate dilation of the small or large bowel loops. No evidence of abnormal bowel wall thickening or inflammatory changes. The appendix is unremarkable. There is moderate-to-large stool burden throughout the colon. Vascular/Lymphatic: No ascites or pneumoperitoneum. No abdominal or pelvic lymphadenopathy, by size criteria. No aneurysmal dilation of the major abdominal arteries. There are mild peripheral atherosclerotic vascular calcifications of the aorta and its major branches. Reproductive: The uterus is unremarkable. No large adnexal mass. Other: Redemonstration of periumbilical and supraumbilical right paramedian fat containing ventral hernias. No herniation of bowel loop. The soft tissues and abdominal wall are otherwise unremarkable. Musculoskeletal: No suspicious osseous lesions. There are mild multilevel degenerative changes in the visualized spine. IMPRESSION: 1. No acute inflammatory process identified within the abdomen or pelvis. 2.  Redemonstration of mild central intrahepatic and moderate-to-severe extrahepatic bile duct dilation, without obstructing mass or choledocholithiasis. Findings are essentially unchanged since the prior study. Correlate clinically and with liver function tests. 3. Multiple other nonacute observations, as described above. Electronically Signed   By: Jules Schick M.D.   On: 04/20/2023 15:06   DG Chest Port 1 View  Result Date: 04/20/2023 CLINICAL DATA:  Weakness. EXAM: PORTABLE CHEST 1 VIEW COMPARISON:  04/16/2007. FINDINGS: Limited exam due to overexposure. Bilateral lung fields are clear. Bilateral costophrenic angles are clear. Normal cardio-mediastinal silhouette. No acute osseous abnormalities. The soft  tissues are within normal limits. IMPRESSION: *No active disease. Electronically Signed   By: Jules Schick M.D.   On: 04/20/2023 14:56    Procedures Procedures    Medications Ordered in ED Medications  HYDROcodone-acetaminophen (NORCO/VICODIN) 5-325 MG per tablet 1 tablet (1 tablet Oral Given 04/20/23 1437)  sodium chloride 0.9 % bolus 1,000 mL (0 mLs Intravenous Stopped 04/20/23 1524)  iohexol (OMNIPAQUE) 350 MG/ML injection 55 mL (55 mLs Intravenous Contrast Given 04/20/23 1441)    ED Course/ Medical Decision Making/ A&P Clinical Course as of 04/20/23 2014  Thu Apr 20, 2023  1321 Lab work does not show any obvious explanation for patient's radicular symptoms nor her weight loss early satiety.  Have put her in for some imaging with chest x-ray and a CT abdomen and pelvis.  Updated family and patient. [MB]  1346 Chest x-ray does not show any suspicious lesions.  Awaiting radiology reading. [MB]    Clinical Course User Index [MB] Terrilee Files, MD                                 Medical Decision Making Amount and/or Complexity of Data Reviewed Labs: ordered. Radiology: ordered.  Risk Prescription drug management.   This patient complains of low back pain radiating down her right thigh, weight loss, poor appetite; this involves an extensive number of treatment Options and is a complaint that carries with it a high risk of complications and morbidity. The differential includes sciatica, radiculopathy, malignancy, fracture, UTI  I ordered, reviewed and interpreted labs, which included CBC with mildly low white count normal hemoglobin, chemistries and LFTs normal, urinalysis without signs of infection I ordered medication IV fluids oral pain medicine and reviewed PMP when indicated. I ordered imaging studies which included chest x-ray, CT abdomen and pelvis and I independently    visualized and interpreted imaging which showed stable bile duct dilatation no acute  findings Additional history obtained from patient's husband and another family member Previous records obtained and reviewed notes from our med Center and also from [facility] regional Cardiac monitoring reviewed, sinus tachycardia improving to normal sinus rhythm Social determinants considered, no significant barriers Critical Interventions: None  After the interventions stated above, I reevaluated the patient and found patient's pain to be improved and neurologically intact Admission and further testing considered, no indications for admission.  Recommend close follow-up with PCP and given contact information for spine clinic.  Given prescription for pain medicine.  Return instructions discussed         Final Clinical Impression(s) / ED Diagnoses Final diagnoses:  Sciatica of right side  Weight loss, non-intentional    Rx / DC Orders ED Discharge Orders          Ordered    oxyCODONE-acetaminophen (PERCOCET/ROXICET) 5-325 MG tablet  Every 8 hours PRN  04/20/23 1522              Terrilee Files, MD 04/20/23 2016

## 2023-04-24 DIAGNOSIS — M9904 Segmental and somatic dysfunction of sacral region: Secondary | ICD-10-CM | POA: Diagnosis not present

## 2023-04-24 DIAGNOSIS — M9903 Segmental and somatic dysfunction of lumbar region: Secondary | ICD-10-CM | POA: Diagnosis not present

## 2023-04-24 DIAGNOSIS — M5417 Radiculopathy, lumbosacral region: Secondary | ICD-10-CM | POA: Diagnosis not present

## 2023-04-24 DIAGNOSIS — M62830 Muscle spasm of back: Secondary | ICD-10-CM | POA: Diagnosis not present

## 2023-05-09 DIAGNOSIS — M9905 Segmental and somatic dysfunction of pelvic region: Secondary | ICD-10-CM | POA: Diagnosis not present

## 2023-05-09 DIAGNOSIS — M5116 Intervertebral disc disorders with radiculopathy, lumbar region: Secondary | ICD-10-CM | POA: Diagnosis not present

## 2023-05-09 DIAGNOSIS — M25551 Pain in right hip: Secondary | ICD-10-CM | POA: Diagnosis not present

## 2023-05-09 DIAGNOSIS — M9903 Segmental and somatic dysfunction of lumbar region: Secondary | ICD-10-CM | POA: Diagnosis not present

## 2023-05-22 ENCOUNTER — Ambulatory Visit
Admission: EM | Admit: 2023-05-22 | Discharge: 2023-05-22 | Disposition: A | Discharge status: Home / Self Care | Payer: 59 | Attending: Family Medicine | Admitting: Family Medicine

## 2023-05-22 DIAGNOSIS — L03114 Cellulitis of left upper limb: Secondary | ICD-10-CM | POA: Diagnosis not present

## 2023-05-22 MED ORDER — DOXYCYCLINE HYCLATE 100 MG PO CAPS: 100.0000 mg | ORAL_CAPSULE | Freq: Two times a day (BID) | ORAL | 0 refills | Status: AC

## 2023-05-22 NOTE — ED Triage Notes (Signed)
Pt presents to uc with co of left hand rash and pain. Pt reports small cut 3 days ago with new onset swelling and redness to area.

## 2023-05-22 NOTE — ED Provider Notes (Signed)
[facility]    CSN: [id number] Arrival date & time: 05/22/23  1553      History   Chief Complaint Chief Complaint  Patient presents with   Rash    HPI Kristen West is a 57 y.o. female.   HPI Very pleasant 57 year old female presents with possible infection of left hand.  Patient is accompanied by her husband who reports a week ago she had tiny cuts of left palm/left index finger that became red and swollen.  PMH significant for nerve compression and T2DM.  Past Medical History:  Diagnosis Date   Diabetes mellitus without complication (HCC)    Nerve compression     Patient Active Problem List   Diagnosis Date Noted   Left upper arm pain 03/07/2016   Diabetes mellitus type 2, uncontrolled 02/22/2016   Numbness and tingling in right hand 02/22/2016   GERD (gastroesophageal reflux disease) 02/22/2016   Tinea pedis of both feet 02/22/2016   Eye abnormalities 12/28/2013    Past Surgical History:  Procedure Laterality Date   CESAREAN SECTION     HERNIA REPAIR      OB History   No obstetric history on file.      Home Medications    Prior to Admission medications   Medication Sig Start Date End Date Taking? Authorizing Provider  doxycycline (VIBRAMYCIN) 100 MG capsule Take 1 capsule (100 mg total) by mouth 2 (two) times daily for 7 days. 05/22/23 05/29/23 Yes Trevor Iha, FNP  metFORMIN (GLUCOPHAGE) 500 MG tablet Take 1 tablet (500 mg total) by mouth 2 (two) times daily with a meal. Start this medication on 11/07/17 11/05/17   Antony Madura, PA-C  oxyCODONE-acetaminophen (PERCOCET/ROXICET) 5-325 MG tablet Take 1 tablet by mouth every 8 (eight) hours as needed for severe pain (pain score 7-10). 04/20/23   Terrilee Files, MD  pantoprazole (PROTONIX) 20 MG tablet Take 1 tablet (20 mg total) by mouth daily. 11/04/17 12/04/17  Cristina Gong, PA-C    Family History Family History  Problem Relation Age of Onset   Diabetes Father    Diabetes  Sister     Social History Social History   Tobacco Use   Smoking status: Never   Smokeless tobacco: Never  Substance Use Topics   Alcohol use: No   Drug use: No     Allergies   Patient has no known allergies.   Review of Systems Review of Systems  Skin:  Positive for rash.  All other systems reviewed and are negative.    Physical Exam Triage Vital Signs ED Triage Vitals  Encounter Vitals Group     BP      Systolic BP Percentile      Diastolic BP Percentile      Pulse      Resp      Temp      Temp src      SpO2      Weight      Height      Head Circumference      Peak Flow      Pain Score      Pain Loc      Pain Education      Exclude from Growth Chart    No data found.  Updated Vital Signs BP 133/84   Pulse (!) 104   Temp (!) 97.4 F (36.3 C)   Resp 16   SpO2 98%   Visual Acuity Right Eye Distance:   Left Eye  Distance:   Bilateral Distance:    Right Eye Near:   Left Eye Near:    Bilateral Near:     Physical Exam Vitals and nursing note reviewed.  Constitutional:      Appearance: Normal appearance. She is normal weight.  HENT:     Head: Normocephalic and atraumatic.     Mouth/Throat:     Mouth: Mucous membranes are moist.     Pharynx: Oropharynx is clear.  Eyes:     Extraocular Movements: Extraocular movements intact.     Conjunctiva/sclera: Conjunctivae normal.     Pupils: Pupils are equal, round, and reactive to light.  Cardiovascular:     Rate and Rhythm: Normal rate and regular rhythm.     Pulses: Normal pulses.     Heart sounds: Normal heart sounds.  Pulmonary:     Effort: Pulmonary effort is normal.     Breath sounds: Normal breath sounds. No wheezing, rhonchi or rales.  Musculoskeletal:        General: Normal range of motion.     Cervical back: Normal range of motion and neck supple.  Skin:    General: Skin is warm and dry.     Comments: Left hand (palmar aspect over thenar eminence): Erythematous with mild soft tissue  swelling noted-please see images below  Neurological:     General: No focal deficit present.     Mental Status: She is alert and oriented to person, place, and time. Mental status is at baseline.  Psychiatric:        Mood and Affect: Mood normal.        Behavior: Behavior normal.         UC Treatments / Results  Labs (all labs ordered are listed, but only abnormal results are displayed) Labs Reviewed - No data to display  EKG   Radiology No results found.  Procedures Procedures (including critical care time)  Medications Ordered in UC Medications - No data to display  Initial Impression / Assessment and Plan / UC Course  I have reviewed the triage vital signs and the nursing notes.  Pertinent labs & imaging results that were available during my care of the patient were reviewed by me and considered in my medical decision making (see chart for details).     MDM: 1.  Cellulitis of left hand-Rx'd doxycycline 100 mg capsule: Take 1 capsule twice daily x 7 days. Advised patient to take medication as directed with food to completion.  Encouraged to increase daily water intake to 64 ounces per day while taking this medication.  Advised if symptoms worsen and/or unresolved please follow-up with PCP or here for further evaluation.  Patient discharged home, hemodynamically stable. Final Clinical Impressions(s) / UC Diagnoses   Final diagnoses:  Cellulitis of left hand     Discharge Instructions      Advised patient to take medication as directed with food to completion.  Encouraged to increase daily water intake to 64 ounces per day while taking this medication.  Advised if symptoms worsen and/or unresolved please follow-up with PCP or here for further evaluation.     ED Prescriptions     Medication Sig Dispense Auth. Provider   doxycycline (VIBRAMYCIN) 100 MG capsule Take 1 capsule (100 mg total) by mouth 2 (two) times daily for 7 days. 14 capsule Trevor Iha, FNP       PDMP not reviewed this encounter.   Trevor Iha, FNP 05/22/23 1650

## 2023-05-22 NOTE — Discharge Instructions (Addendum)
 Advised patient to take medication as directed with food to completion.  Encouraged to increase daily water intake to 64 ounces per day while taking this medication.  Advised if symptoms worsen and/or unresolved please follow-up with PCP or here for further evaluation.

## 2023-11-23 ENCOUNTER — Other Ambulatory Visit: Payer: Self-pay | Admitting: Internal Medicine

## 2023-11-23 DIAGNOSIS — Z1231 Encounter for screening mammogram for malignant neoplasm of breast: Secondary | ICD-10-CM

## 2024-06-15 ENCOUNTER — Other Ambulatory Visit: Payer: Self-pay

## 2024-06-15 ENCOUNTER — Ambulatory Visit: Payer: Self-pay | Admitting: Cardiovascular Disease

## 2024-06-15 VITALS — BP 130/60 | HR 102 | Temp 99.1°F | Ht <= 58 in | Wt 104.8 lb

## 2024-06-15 DIAGNOSIS — E785 Hyperlipidemia, unspecified: Secondary | ICD-10-CM

## 2024-06-15 NOTE — Progress Notes (Signed)
" ° °  Established Patient Office Visit  Subjective   Patient ID: Kristen West, female    DOB: [birth year]  Age: 58 y.o. MRN: [id number]  Chief Complaint  Patient presents with   Diabetes    Pt is here today to establish care for her diabetes management. She also needs a refill for her metformin . BG was taken in clinic today after her first meal 249 mg/dL    HPI  59 year old female who is here today to establish general care.  She has been diabetic for at least 5 years and has hyperlipidemia.  She denies any chest pain or shortness of breath.  She takes metformin  and simvastatin.  ROS    Objective:     BP 130/60 (BP Location: Left Arm, Patient Position: Sitting)   Pulse (!) 102   Temp 99.1 F (37.3 C)   Ht 4' 8 (1.422 m)   Wt 104 lb 12.8 oz (47.5 kg)   SpO2 99%   BMI 23.50 kg/m    Physical Exam Heart is regular with 2/6 systolic murmur in the aortic area.  Lungs are clear to auscultation.  No results found for any visits on 06/15/24.    The ASCVD Risk score (Arnett DK, et al., 2019) failed to calculate for the following reasons:   Cannot find a previous HDL lab   Cannot find a previous total cholesterol lab   * - Cholesterol units were assumed    Assessment & Plan:   1.Type 2 diabetes: Likely uncontrolled with random blood sugar of 245 today.  Will check routine labs including hemoglobin A1c.  Continue metformin  for now and suspect that we will have to increase the dose.  2.  Hyperlipidemia: On simvastatin.  I requested lipid and liver profile and routine labs.  3.  Systolic murmur suggestive of aortic sclerosis.  An EKG was done today and it was normal.  Will monitor clinically for now and consider an echocardiogram at some point.  The patient was evaluated today at the [facility] in [place]. Services were provided at no cost to the patient. This visit was part of our ongoing community outreach efforts to improve access to healthcare for  underserved populations.   Fu in 1 month  Deatrice Cage, MD  "

## 2024-07-20 ENCOUNTER — Ambulatory Visit: Payer: Self-pay | Admitting: Internal Medicine
